# Patient Record
Sex: Male | Born: 1985
Health system: Southern US, Community
[De-identification: ages and names within clinical notes are randomized; demographics above are authoritative.]

## PROBLEM LIST (undated history)

## (undated) DIAGNOSIS — I10 Essential (primary) hypertension: Secondary | ICD-10-CM

## (undated) DIAGNOSIS — T23039A Burn of unspecified degree of unspecified multiple fingers (nail), not including thumb, initial encounter: Secondary | ICD-10-CM

## (undated) HISTORY — PX: CIRCUMCISION: SHX1350

---

## 2007-10-30 ENCOUNTER — Emergency Department (HOSPITAL_COMMUNITY): Admission: EM | Admit: 2007-10-30 | Discharge: 2007-10-30 | Payer: Self-pay | Admitting: Emergency Medicine

## 2009-10-24 ENCOUNTER — Emergency Department (HOSPITAL_COMMUNITY): Admission: EM | Admit: 2009-10-24 | Discharge: 2009-10-24 | Payer: Self-pay | Admitting: Emergency Medicine

## 2010-01-15 ENCOUNTER — Emergency Department (HOSPITAL_COMMUNITY)
Admission: EM | Admit: 2010-01-15 | Discharge: 2010-01-15 | Payer: Self-pay | Source: Home / Self Care | Admitting: Emergency Medicine

## 2010-07-28 ENCOUNTER — Emergency Department (HOSPITAL_COMMUNITY): Payer: No Typology Code available for payment source

## 2010-07-28 ENCOUNTER — Emergency Department (HOSPITAL_COMMUNITY)
Admission: EM | Admit: 2010-07-28 | Discharge: 2010-07-28 | Disposition: A | Discharge status: Home / Self Care | Payer: No Typology Code available for payment source | Attending: Emergency Medicine | Admitting: Emergency Medicine

## 2010-07-28 DIAGNOSIS — M542 Cervicalgia: Secondary | ICD-10-CM | POA: Insufficient documentation

## 2010-07-28 DIAGNOSIS — M549 Dorsalgia, unspecified: Secondary | ICD-10-CM | POA: Insufficient documentation

## 2010-07-28 DIAGNOSIS — S139XXA Sprain of joints and ligaments of unspecified parts of neck, initial encounter: Secondary | ICD-10-CM | POA: Insufficient documentation

## 2010-07-28 DIAGNOSIS — M25519 Pain in unspecified shoulder: Secondary | ICD-10-CM | POA: Insufficient documentation

## 2010-07-28 DIAGNOSIS — S335XXA Sprain of ligaments of lumbar spine, initial encounter: Secondary | ICD-10-CM | POA: Insufficient documentation

## 2011-11-07 ENCOUNTER — Emergency Department (HOSPITAL_COMMUNITY)
Admission: EM | Admit: 2011-11-07 | Discharge: 2011-11-07 | Disposition: A | Discharge status: Home / Self Care | Payer: Self-pay | Attending: Emergency Medicine | Admitting: Emergency Medicine

## 2011-11-07 ENCOUNTER — Encounter (HOSPITAL_COMMUNITY): Payer: Self-pay | Admitting: Emergency Medicine

## 2011-11-07 DIAGNOSIS — X58XXXA Exposure to other specified factors, initial encounter: Secondary | ICD-10-CM | POA: Insufficient documentation

## 2011-11-07 DIAGNOSIS — F1010 Alcohol abuse, uncomplicated: Secondary | ICD-10-CM | POA: Insufficient documentation

## 2011-11-07 DIAGNOSIS — S61209A Unspecified open wound of unspecified finger without damage to nail, initial encounter: Secondary | ICD-10-CM | POA: Insufficient documentation

## 2011-11-07 DIAGNOSIS — S61218A Laceration without foreign body of other finger without damage to nail, initial encounter: Secondary | ICD-10-CM

## 2011-11-07 DIAGNOSIS — F17200 Nicotine dependence, unspecified, uncomplicated: Secondary | ICD-10-CM | POA: Insufficient documentation

## 2011-11-07 MED ORDER — LIDOCAINE HCL 2 % IJ SOLN
INTRAMUSCULAR | Status: AC
  Administered 2011-11-07: 02:00:00
  Filled 2011-11-07: qty 20
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
## 2011-11-07 NOTE — ED Provider Notes (Signed)
Medical screening examination/treatment/procedure(s) were conducted as a shared visit with non-physician practitioner(s) and myself.  I personally evaluated the patient during the encounter  About 0.25" laceration right index finger. Finger distally neurovascularly intact.  Hanley Seamen, MD 11/07/11 [phone]

## 2012-12-24 ENCOUNTER — Encounter (HOSPITAL_COMMUNITY): Payer: Self-pay | Admitting: Emergency Medicine

## 2012-12-24 ENCOUNTER — Emergency Department (HOSPITAL_COMMUNITY)
Admission: EM | Admit: 2012-12-24 | Discharge: 2012-12-25 | Disposition: A | Discharge status: Home / Self Care | Payer: BC Managed Care – PPO | Attending: Emergency Medicine | Admitting: Emergency Medicine

## 2012-12-24 DIAGNOSIS — Z23 Encounter for immunization: Secondary | ICD-10-CM | POA: Insufficient documentation

## 2012-12-24 DIAGNOSIS — S0285XA Fracture of orbit, unspecified, initial encounter for closed fracture: Secondary | ICD-10-CM

## 2012-12-24 DIAGNOSIS — S02109A Fracture of base of skull, unspecified side, initial encounter for closed fracture: Secondary | ICD-10-CM | POA: Insufficient documentation

## 2012-12-24 DIAGNOSIS — F1010 Alcohol abuse, uncomplicated: Secondary | ICD-10-CM | POA: Insufficient documentation

## 2012-12-24 DIAGNOSIS — S0993XA Unspecified injury of face, initial encounter: Secondary | ICD-10-CM | POA: Insufficient documentation

## 2012-12-24 DIAGNOSIS — IMO0002 Reserved for concepts with insufficient information to code with codable children: Secondary | ICD-10-CM

## 2012-12-24 DIAGNOSIS — F17200 Nicotine dependence, unspecified, uncomplicated: Secondary | ICD-10-CM | POA: Insufficient documentation

## 2012-12-24 DIAGNOSIS — S0180XA Unspecified open wound of other part of head, initial encounter: Secondary | ICD-10-CM | POA: Insufficient documentation

## 2012-12-24 DIAGNOSIS — S0990XA Unspecified injury of head, initial encounter: Secondary | ICD-10-CM | POA: Insufficient documentation

## 2012-12-24 MED ORDER — TETANUS-DIPHTH-ACELL PERTUSSIS 5-2.5-18.5 LF-MCG/0.5 IM SUSP
0.5000 mL | Freq: Once | INTRAMUSCULAR | Status: AC
  Administered 2012-12-25: 0.5 mL via INTRAMUSCULAR
  Filled 2012-12-24: qty 0.5

## 2012-12-24 NOTE — ED Notes (Signed)
NP into room with security and [employer] present.
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
## 2012-12-24 NOTE — ED Notes (Signed)
Pt yelling obscenities at staff. NT at bedside to assist with cleaning wounds.
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
## 2012-12-24 NOTE — ED Notes (Addendum)
EDNP into room, visitor instructed NP to wait, NP out of room.
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
## 2012-12-24 NOTE — ED Notes (Signed)
Pt alert, NAD, calm, interactive with visitor in room, ambulatory in room with steady gait, pt cursing as part of general language.

## 2012-12-25 ENCOUNTER — Encounter (HOSPITAL_COMMUNITY): Payer: Self-pay | Admitting: Radiology

## 2012-12-25 ENCOUNTER — Emergency Department (HOSPITAL_COMMUNITY): Payer: BC Managed Care – PPO

## 2012-12-25 MED ORDER — LIDOCAINE-EPINEPHRINE 2 %-1:100000 IJ SOLN
20.0000 mL | Freq: Once | INTRAMUSCULAR | Status: AC
  Administered 2012-12-25: 20 mL

## 2012-12-25 MED ORDER — HYDROCODONE-ACETAMINOPHEN 5-325 MG PO TABS: 1.0000 | ORAL_TABLET | ORAL | Status: DC | PRN

## 2012-12-25 MED ORDER — IBUPROFEN 600 MG PO TABS: 600.0000 mg | ORAL_TABLET | Freq: Four times a day (QID) | ORAL | Status: DC | PRN

## 2012-12-25 NOTE — ED Notes (Signed)
Pt to CT via stretcher, alert, NAD, calm, no changes.
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
## 2012-12-25 NOTE — ED Notes (Signed)
Pt ambulatory with unsteady gait to room FT10 from room FT4, fall risk explained, fall risk band placed, new ID band placed after pt ripped of last one placed. Placed in stretcher with SR up x2, instructed to not get up, ask for help, CBIR, visitor at [facility], pt & visitor verbalize understanding.

## 2012-12-25 NOTE — ED Notes (Addendum)
Back from CT, no changes, visitor at [facility], CBIR.

## 2012-12-26 NOTE — ED Provider Notes (Signed)
Medical screening examination/treatment/procedure(s) were performed by non-physician practitioner and as supervising physician I was immediately available for consultation/collaboration.    Sunnie Nielsen, MD 12/26/12 [phone]

## 2013-08-09 ENCOUNTER — Other Ambulatory Visit: Payer: Self-pay | Admitting: Orthopaedic Surgery

## 2013-09-07 ENCOUNTER — Encounter (HOSPITAL_BASED_OUTPATIENT_CLINIC_OR_DEPARTMENT_OTHER): Payer: Self-pay | Admitting: *Deleted

## 2013-09-07 NOTE — H&P (Signed)
Daniel Strickland is an 28 y.o. male.   Chief Complaint: left knee pain HPI: Kaycen continues with some knee pain and feelings of instability. He is wearing a brace. He says his motion is a little better. He is about a month from his injury at this point. He has been through an MRI scan. He continues to work his job in [employer].   MRI:  I reviewed an MRI scan films and report of a study done at the [facility] on 07/03/13. This shows a complete ACL tear along with a radial tear of the medial meniscus in the posterior horn. It also shows a nondisplaced impaction fracture of the posterior medial aspect of the tibial plateau.  History reviewed. No pertinent past medical history.  Past Surgical History  Procedure Laterality Date  . Circumsion      at age 28    History reviewed. No pertinent family history. Social History:  reports that he has been smoking Cigarettes.  He has been smoking about 0.00 packs per day. He does not have any smokeless tobacco history on file. He reports that he drinks alcohol. He reports that he does not use illicit drugs.  Allergies: No Known Allergies  No prescriptions prior to admission    No results found for this or any previous visit (from the past 48 hour(s)). No results found.  Review of Systems  Musculoskeletal: Positive for joint pain.       Left knee  All other systems reviewed and are negative.   Height 6' (1.829 m), weight 112.038 kg (247 lb). Physical Exam  Constitutional: He is oriented to person, place, and time. He appears well-developed and well-nourished.  HENT:  Head: Normocephalic and atraumatic.  Eyes: Conjunctivae and EOM are normal. Pupils are equal, round, and reactive to light.  Neck: Normal range of motion.  Cardiovascular: Normal rate and regular rhythm.   Respiratory: Effort normal.  GI: Soft.  Musculoskeletal:  Left knee has trace effusion today. Motion is 0-130. He has pain on the medial joint line. Lachman's  test is loose as his drawer test. He has good stability to varus and valgus stress.  Hip motion is full and pain free and SLR is negative on both sides.  There is no palpable LAD behind either knee.  Sensation and motor function are intact on both sides and there are palpable pulses on both sides.    Neurological: He is alert and oriented to person, place, and time.  Skin: Skin is warm and dry.  Psychiatric: He has a normal mood and affect. His behavior is normal. Judgment and thought content normal.     Assessment/Plan Assessment:   Left knee ACL tear  Plan: Chay has an unstable knee. He would like to get back to basketball and also have a knee stable enough to do his job without trouble. I reviewed risk of anesthesia, infection, and DVT related to an ACL reconstruction with middle third patellar tendon autograft. We will also take care of the small meniscal injury. I also stressed the importance of the postoperative rehabilitation protocol to optimize his result.  Nataliee Shurtz, Ginger OrganNDREW PAUL 09/07/2013, 9:04 AM

## 2013-09-07 NOTE — Pre-Procedure Instructions (Signed)
Pack an overnight bag for RCC.  

## 2013-09-12 ENCOUNTER — Ambulatory Visit (HOSPITAL_BASED_OUTPATIENT_CLINIC_OR_DEPARTMENT_OTHER): Admission: RE | Admit: 2013-09-12 | Payer: 59 | Source: Ambulatory Visit | Admitting: Orthopaedic Surgery

## 2013-09-12 SURGERY — REPAIR, KNEE, ACL
Anesthesia: Choice | Site: Knee | Laterality: Left

## 2014-07-25 ENCOUNTER — Emergency Department (HOSPITAL_COMMUNITY)
Admission: EM | Admit: 2014-07-25 | Discharge: 2014-07-25 | Disposition: A | Discharge status: Home / Self Care | Payer: 59 | Attending: Emergency Medicine | Admitting: Emergency Medicine

## 2014-07-25 ENCOUNTER — Encounter (HOSPITAL_COMMUNITY): Payer: Self-pay | Admitting: *Deleted

## 2014-07-25 ENCOUNTER — Emergency Department (HOSPITAL_COMMUNITY): Payer: 59

## 2014-07-25 DIAGNOSIS — Y9367 Activity, basketball: Secondary | ICD-10-CM | POA: Insufficient documentation

## 2014-07-25 DIAGNOSIS — X58XXXA Exposure to other specified factors, initial encounter: Secondary | ICD-10-CM | POA: Insufficient documentation

## 2014-07-25 DIAGNOSIS — Y998 Other external cause status: Secondary | ICD-10-CM | POA: Insufficient documentation

## 2014-07-25 DIAGNOSIS — Y92310 Basketball court as the place of occurrence of the external cause: Secondary | ICD-10-CM | POA: Insufficient documentation

## 2014-07-25 DIAGNOSIS — S4991XA Unspecified injury of right shoulder and upper arm, initial encounter: Secondary | ICD-10-CM | POA: Insufficient documentation

## 2014-07-25 DIAGNOSIS — Z720 Tobacco use: Secondary | ICD-10-CM | POA: Insufficient documentation

## 2014-07-25 DIAGNOSIS — M25511 Pain in right shoulder: Secondary | ICD-10-CM

## 2014-07-25 MED ORDER — IBUPROFEN 800 MG PO TABS: 800.0000 mg | ORAL_TABLET | Freq: Three times a day (TID) | ORAL | Status: DC

## 2014-07-25 NOTE — ED Notes (Addendum)
Pt states that he was playing basketball with his kids and he felt his right shoulder pop out of place. States that he was able to get it back in and when he put his shirt back on the shoulder came back out of place. States that it went back in on its own but he feels like it will pop back out again. Strong pulse to rt wrist present. Normal coloring.

## 2014-07-25 NOTE — ED Provider Notes (Signed)
CSN: [id number]     Arrival date & time 07/25/14  1849 History  This chart was scribed for Roxy Horseman, PA-C working with Cathren Laine, MD by Evon Slack, ED Scribe. This patient was seen in room TR03C/TR03C and the patient's care was started at 8:02 PM.    Chief Complaint  Patient presents with  . Shoulder Injury   Patient is a 29 y.o. male presenting with shoulder injury. The history is provided by the patient. No language interpreter was used.  Shoulder Injury Pertinent negatives include no chest pain and no shortness of breath.   HPI Comments: Kwadwo Taras Matsumoto is a 29 y.o. male who presents to the Emergency Department complaining of right shoulder injury onset today PTA. Pt states that he was playing basketball and felt his shoulder pop out of place after swatting the ball. Pt state that he was able to pop the shoulder back into place. He states that when placing his shirt back on the shoulder re-dislocated. Pt states that he feels as if the shoulder may pop back out again. Pt doesn't report any medications PTA. Pt states that 2 months prior his shoulder did pop out of place but popped back in with no other complications. Pt doesn't report any numbness or tingling.   History reviewed. No pertinent past medical history. Past Surgical History  Procedure Laterality Date  . Circumsion      at age 38   No family history on file. History  Substance Use Topics  . Smoking status: Current Every Day Smoker -- 0.50 packs/day    Types: Cigarettes  . Smokeless tobacco: Not on file     Comment: Smokea 1-2 cigarettes a week  . Alcohol Use: Yes     Comment: Drinks weekends    Review of Systems  Constitutional: Negative for fever and chills.  Respiratory: Negative for shortness of breath.   Cardiovascular: Negative for chest pain.  Gastrointestinal: Negative for nausea, vomiting, diarrhea and constipation.  Genitourinary: Negative for dysuria.  Musculoskeletal: Positive for  arthralgias.  Neurological: Negative for numbness.      Allergies  Review of patient's allergies indicates no known allergies.  Home Medications   Prior to Admission medications   Not on File   BP 137/95 mmHg  Pulse 102  Temp(Src) 98.5 F (36.9 C) (Oral)  Resp 18  Ht 6' (1.829 m)  Wt 233 lb (105.688 kg)  BMI 31.59 kg/m2  SpO2 98%   Physical Exam  Constitutional: He is oriented to person, place, and time. He appears well-developed and well-nourished. No distress.  HENT:  Head: Normocephalic and atraumatic.  Eyes: Conjunctivae and EOM are normal.  Neck: Neck supple. No tracheal deviation present.  Cardiovascular: Intact distal pulses.   Pulmonary/Chest: Effort normal. No respiratory distress.  Musculoskeletal: Normal range of motion.  Right shoulder mildly ttp, no bony abnormality, ROM and strength limited 2/2 pain  Neurological: He is alert and oriented to person, place, and time.  Sensation intact  Skin: Skin is warm and dry.  Psychiatric: He has a normal mood and affect. His behavior is normal.  Nursing note and vitals reviewed.   ED Course  Procedures (including critical care time) DIAGNOSTIC STUDIES: Oxygen Saturation is 98% on RA, normal by my interpretation.    COORDINATION OF CARE: 8:10 PM-Discussed treatment plan with pt at bedside and pt agreed to plan.     Labs Review Labs Reviewed - No data to display  Imaging Review Dg Shoulder Right  07/25/2014  CLINICAL DATA:  The patient reports his right shoulder popped out of place while playing basketball today. Pain. Initial encounter.  EXAM: RIGHT SHOULDER - 2+ VIEW  COMPARISON:  None.  FINDINGS: The humerus is located. No fracture is identified. The acromioclavicular joint is intact. Image right lung and ribs appear normal.  IMPRESSION: Negative exam.   Electronically Signed   By: Drusilla Kannerhomas  Dalessio M.D.   On: 07/25/2014 19:52     EKG Interpretation None      MDM   Final diagnoses:  Right shoulder  pain    Patient with right shoulder pain.  Imaging is negative.  Patient states that the shoulder has dislocated and relocated several times today.  Will give sling immobilizer and recommend ortho follow-up.  I personally performed the services described in this documentation, which was scribed in my presence. The recorded information has been reviewed and is accurate.       Roxy Horsemanobert Dewane Timson, PA-C 07/25/14 2023  Cathren LaineKevin Steinl, MD 07/30/14 [phone]

## 2014-07-25 NOTE — Discharge Instructions (Signed)
Acromioclavicular Injuries °The AC (acromioclavicular) joint is the joint in the shoulder where the collarbone (clavicle) meets the shoulder blade (scapula). The part of the shoulder blade connected to the collarbone is called the acromion. Common problems with and treatments for the AC joint are detailed below. °ARTHRITIS °Arthritis occurs when the joint has been injured and the smooth padding between the joints (cartilage) is lost. This is the wear and tear seen in most joints of the body if they have been overused. This causes the joint to produce pain and swelling which is worse with activity.  °AC JOINT SEPARATION °AC joint separation means that the ligaments connecting the acromion of the shoulder blade and collarbone have been damaged, and the two bones no longer line up. AC separations can be anywhere from mild to severe, and are "graded" depending upon which ligaments are torn and how badly they are torn. °· Grade I Injury: the least damage is done, and the AC joint still lines up. °· Grade II Injury: damage to the ligaments which reinforce the AC joint. In a Grade II injury, these ligaments are stretched but not entirely torn. When stressed, the AC joint becomes painful and unstable. °· Grade III Injury: AC and secondary ligaments are completely torn, and the collarbone is no longer attached to the shoulder blade. This results in deformity; a prominence of the end of the clavicle. °AC JOINT FRACTURE °AC joint fracture means that there has been a break in the bones of the AC joint, usually the end of the clavicle. °TREATMENT °TREATMENT OF AC ARTHRITIS °· There is currently no way to replace the cartilage damaged by arthritis. The best way to improve the condition is to decrease the activities which aggravate the problem. Application of ice to the joint helps decrease pain and soreness (inflammation). The use of non-steroidal anti-inflammatory medication is helpful. °· If less conservative measures do not  work, then cortisone shots (injections) may be used. These are anti-inflammatories; they decrease the soreness in the joint and swelling. °· If non-surgical measures fail, surgery may be recommended. The procedure is generally removal of a portion of the end of the clavicle. This is the part of the collarbone closest to your acromion which is stabilized with ligaments to the acromion of the shoulder blade. This surgery may be performed using a tube-like instrument with a light (arthroscope) for looking into a joint. It may also be performed as an open surgery through a small incision by the surgeon. Most patients will have good range of motion within 6 weeks and may return to all activity including sports by 8-12 weeks, barring complications. °TREATMENT OF AN AC SEPARATION °· The initial treatment is to decrease pain. This is best accomplished by immobilizing the arm in a sling and placing an ice pack to the shoulder for 20 to 30 minutes every 2 hours as needed. As the pain starts to subside, it is important to begin moving the fingers, wrist, elbow and eventually the shoulder in order to prevent a stiff or "frozen" shoulder. Instruction on when and how much to move the shoulder will be provided by your caregiver. The length of time needed to regain full motion and function depends on the amount or grade of the injury. Recovery from a Grade I AC separation usually takes 10 to 14 days, whereas a Grade III may take 6 to 8 weeks. °· Grade I and II separations usually do not require surgery. Even Grade III injuries usually allow return to full   activity with few restrictions. Treatment is also based on the activity demands of the injured shoulder. For example, a high level quarterback with an injured throwing arm will receive more aggressive treatment than someone with a desk job who rarely uses his/her arm for strenuous activities. In some cases, a painful lump may persist which could require a later surgery. Surgery  can be very successful, but the benefits must be weighed against the potential risks. °TREATMENT OF AN AC JOINT FRACTURE °Fracture treatment depends on the type of fracture. Sometimes a splint or sling may be all that is required. Other times surgery may be required for repair. This is more frequently the case when the ligaments supporting the clavicle are completely torn. Your caregiver will help you with these decisions and together you can decide what will be the best treatment. °HOME CARE INSTRUCTIONS  °· Apply ice to the injury for 15-20 minutes each hour while awake for 2 days. Put the ice in a plastic bag and place a towel between the bag of ice and skin. °· If a sling has been applied, wear it constantly for as long as directed by your caregiver, even at night. The sling or splint can be removed for bathing or showering or as directed. Be sure to keep the shoulder in the same place as when the sling is on. Do not lift the arm. °· If a figure-of-eight splint has been applied it should be tightened gently by another person every day. Tighten it enough to keep the shoulders held back. Allow enough room to place the index finger between the body and strap. Loosen the splint immediately if there is numbness or tingling in the hands. °· Take over-the-counter or prescription medicines for pain, discomfort or fever as directed by your caregiver. °· If you or your child has received a follow up appointment, it is very important to keep that appointment in order to avoid long term complications, chronic pain or disability. °SEEK MEDICAL CARE IF:  °· The pain is not relieved with medications. °· There is increased swelling or discoloration that continues to get worse rather than better. °· You or your child has been unable to follow up as instructed. °· There is progressive numbness and tingling in the arm, forearm or hand. °SEEK IMMEDIATE MEDICAL CARE IF:  °· The arm is numb, cold or pale. °· There is increasing pain  in the hand, forearm or fingers. °MAKE SURE YOU:  °· Understand these instructions. °· Will watch your condition. °· Will get help right away if you are not doing well or get worse. °Document Released: 10/15/2004 Document Revised: 03/30/2011 Document Reviewed: 04/09/2008 °[employer]® Patient Information ©2015 [employer]. This information is not intended to replace advice given to you by your health care provider. Make sure you discuss any questions you have with your health care provider. ° °

## 2014-07-25 NOTE — ED Notes (Signed)
Pt verbalizes understanding of d/c instructions and denies any further needs at this time. 

## 2014-08-01 ENCOUNTER — Emergency Department (HOSPITAL_COMMUNITY)
Admission: EM | Admit: 2014-08-01 | Discharge: 2014-08-01 | Disposition: A | Discharge status: Home / Self Care | Payer: 59 | Attending: Emergency Medicine | Admitting: Emergency Medicine

## 2014-08-01 ENCOUNTER — Encounter (HOSPITAL_COMMUNITY): Payer: Self-pay | Admitting: Emergency Medicine

## 2014-08-01 ENCOUNTER — Emergency Department (HOSPITAL_COMMUNITY): Payer: 59

## 2014-08-01 DIAGNOSIS — S6991XA Unspecified injury of right wrist, hand and finger(s), initial encounter: Secondary | ICD-10-CM | POA: Insufficient documentation

## 2014-08-01 DIAGNOSIS — X58XXXA Exposure to other specified factors, initial encounter: Secondary | ICD-10-CM | POA: Insufficient documentation

## 2014-08-01 DIAGNOSIS — Y9389 Activity, other specified: Secondary | ICD-10-CM | POA: Insufficient documentation

## 2014-08-01 DIAGNOSIS — Y9289 Other specified places as the place of occurrence of the external cause: Secondary | ICD-10-CM | POA: Insufficient documentation

## 2014-08-01 DIAGNOSIS — Z720 Tobacco use: Secondary | ICD-10-CM | POA: Insufficient documentation

## 2014-08-01 DIAGNOSIS — Y998 Other external cause status: Secondary | ICD-10-CM | POA: Insufficient documentation

## 2014-08-01 DIAGNOSIS — M25531 Pain in right wrist: Secondary | ICD-10-CM

## 2014-08-01 MED ORDER — IBUPROFEN 400 MG PO TABS
400.0000 mg | ORAL_TABLET | Freq: Once | ORAL | Status: AC
  Administered 2014-08-01: 400 mg via ORAL
  Filled 2014-08-01: qty 1

## 2014-08-01 MED ORDER — IBUPROFEN 800 MG PO TABS: 800.0000 mg | ORAL_TABLET | Freq: Three times a day (TID) | ORAL | Status: DC

## 2014-08-01 NOTE — ED Provider Notes (Signed)
CSN: [id number]     Arrival date & time 08/01/14  1427 History   This chart was scribed for non-physician practitioner Joycie PeekBenjamin Bohdi Leeds, PA-C working with Raeford RazorStephen Kohut, MD by Lyndel SafeKaitlyn Shelton, ED Scribe. This patient was seen in room TR04C/TR04C and the patient's care was started at 3:47 PM.    Chief Complaint  Patient presents with  . Wrist Pain   The history is provided by the patient. No language interpreter was used.   HPI Comments: Daniel Strickland is a 29 y.o. male who presents to the Emergency Department complaining of sudden onset, constant, sharp right wrist pain s/p injury that occurred 1 day ago. He states the pain is exacerbated with movement. Pt reports he was changing a tire yesterday when he felt his right wrist Strickland. He notes he went to work this morning and was sent home because he could not perform his normal work duties. He has not tried any alleviating medications pta. Denies numbness.   History reviewed. No pertinent past medical history. Past Surgical History  Procedure Laterality Date  . Circumsion      at age 29   History reviewed. No pertinent family history. History  Substance Use Topics  . Smoking status: Current Every Day Smoker -- 0.50 packs/day    Types: Cigarettes  . Smokeless tobacco: Not on file     Comment: Smokea 1-2 cigarettes a week  . Alcohol Use: Yes     Comment: Drinks weekends    Review of Systems  Musculoskeletal: Positive for arthralgias.  Neurological: Negative for numbness.   Allergies  Review of patient's allergies indicates no known allergies.  Home Medications   Prior to Admission medications   Medication Sig Start Date End Date Taking? Authorizing Provider  ibuprofen (ADVIL,MOTRIN) 800 MG tablet Take 1 tablet (800 mg total) by mouth 3 (three) times daily. 08/01/14   Joycie PeekBenjamin Isamar Nazir, PA-C   BP 141/87 mmHg  Pulse 90  Temp(Src) 98.4 F (36.9 C) (Oral)  Resp 18  Ht 6' (1.829 m)  Wt 245 lb 8 oz (111.358 kg)  BMI 33.29 kg/m2   SpO2 98% Physical Exam  Constitutional: He is oriented to person, place, and time. He appears well-developed and well-nourished. No distress.  HENT:  Head: Normocephalic.  Right Ear: External ear normal.  Left Ear: External ear normal.  Mouth/Throat: No oropharyngeal exudate.  Eyes: Pupils are equal, round, and reactive to light. Right eye exhibits no discharge. Left eye exhibits no discharge. No scleral icterus.  Neck: No JVD present.  Cardiovascular: Normal rate, regular rhythm and normal heart sounds.   Pulmonary/Chest: Effort normal and breath sounds normal. No respiratory distress.  Musculoskeletal: He exhibits no edema.  Range of motion of right wrist slightly limited secondary to only pain. No focal bony tenderness noted. Grip strength decreased secondary to pain only. No obvious lesions or deformities. No overt erythema, warmth or swelling noted. Full range of motion of right elbow  Lymphadenopathy:    He has no cervical adenopathy.  Neurological: He is alert and oriented to person, place, and time.  Cranial nerves II-12 grossly intact. Motor strength grossly intact. Sensation intact to light touch.  Skin: Skin is warm. No rash noted. No erythema. No pallor.  Psychiatric: He has a normal mood and affect. His behavior is normal.  Nursing note and vitals reviewed.   ED Course  Procedures  DIAGNOSTIC STUDIES: Oxygen Saturation is 98% on RA, normal by my interpretation.    COORDINATION OF CARE: 3:50 PM Discussed  treatment plan which includes to order diagnostic imaging of right wrist with pt. Pt acknowledges and agrees to plan.   Imaging Review Dg Wrist Complete Right  08/01/2014   CLINICAL DATA:  Wrist pain. Injury yesterday. Initial encounter. Pain with movement.  EXAM: RIGHT WRIST - COMPLETE 3+ VIEW  COMPARISON:  None.  FINDINGS: Carpal spacing and alignment is within normal limits. No fracture. There is truncation of the ulnar styloid that is consistent with a small erosion  of the ulnar styloid. This is typically associated with inflammatory arthritis such as rheumatoid but can be produced by other forms of synovitis. Nonspecific carpal cyst is present in the lunate.  IMPRESSION: No acute osseous abnormality. Erosion of the ulnar styloid, most commonly associated with inflammatory arthritis such as rheumatoid.   Electronically Signed   By: Andreas Newport M.D.   On: 08/01/2014 16:21    Meds given in ED:  Medications  ibuprofen (ADVIL,MOTRIN) tablet 400 mg (400 mg Oral Given 08/01/14 1555)    New Prescriptions   No medications on file   Filed Vitals:   08/01/14 1546  BP: 141/87  Pulse: 90  Temp: 98.4 F (36.9 C)  TempSrc: Oral  Resp: 18  Height: 6' (1.829 m)  Weight: 245 lb 8 oz (111.358 kg)  SpO2: 98%    MDM  Vitals stable - WNL -afebrile Pt resting comfortably in ED. PE-neurovascularly intact. No evidence of hemarthrosis or septic joint. Range of motion decreased secondary only to pain. However mostly full range of motion is intact. Imaging--plain films of right wrist show no acute osseous abnormalities. There is erosion of the ulnar styloid, most commonly associated with inflammatory arthritis such as rheumatoid. I personally reviewed the imaging and agree with the results as interpreted by the radiologist.  DDX--patient given wrist splint for comfort, anti-inflammatory's for pain. Discussed follow-up with primary care/rheumatology for further evaluation and management of symptoms. No evidence of other acute emergent pathology at this time.  I discussed all relevant lab findings and imaging results with pt and they verbalized understanding. Discussed f/u with PCP within 48 hrs and return precautions, pt very amenable to plan.  Final diagnoses:  Wrist pain, acute, right   I personally performed the services described in this documentation, which was scribed in my presence. The recorded information has been reviewed and is  accurate.    Joycie Peek, PA-C 08/01/14 1648  Raeford Razor, MD 08/03/14 1440

## 2014-08-01 NOTE — Discharge Instructions (Signed)
You were evaluated for your wrist pain. There does not appear to be an emergent cause your symptoms at this time. Your x-rays were negative for any broken bones or dislocations. He will need follow-up with primary care for further valuation management of your symptoms. Return to ED for worsening symptoms. Take your anti-inflammatory's as needed for pain.  Arthralgia Your caregiver has diagnosed you as suffering from an arthralgia. Arthralgia means there is pain in a joint. This can come from many reasons including:  Bruising the joint which causes soreness (inflammation) in the joint.  Wear and tear on the joints which occur as we grow older (osteoarthritis).  Overusing the joint.  Various forms of arthritis.  Infections of the joint. Regardless of the cause of pain in your joint, most of these different pains respond to anti-inflammatory drugs and rest. The exception to this is when a joint is infected, and these cases are treated with antibiotics, if it is a bacterial infection. HOME CARE INSTRUCTIONS   Rest the injured area for as long as directed by your caregiver. Then slowly start using the joint as directed by your caregiver and as the pain allows. Crutches as directed may be useful if the ankles, knees or hips are involved. If the knee was splinted or casted, continue use and care as directed. If an stretchy or elastic wrapping bandage has been applied today, it should be removed and re-applied every 3 to 4 hours. It should not be applied tightly, but firmly enough to keep swelling down. Watch toes and feet for swelling, bluish discoloration, coldness, numbness or excessive pain. If any of these problems (symptoms) occur, remove the ace bandage and re-apply more loosely. If these symptoms persist, contact your caregiver or return to this location.  For the first 24 hours, keep the injured extremity elevated on pillows while lying down.  Apply ice for 15-20 minutes to the sore joint every  couple hours while awake for the first half day. Then 03-04 times per day for the first 48 hours. Put the ice in a plastic bag and place a towel between the bag of ice and your skin.  Wear any splinting, casting, elastic bandage applications, or slings as instructed.  Only take over-the-counter or prescription medicines for pain, discomfort, or fever as directed by your caregiver. Do not use aspirin immediately after the injury unless instructed by your physician. Aspirin can cause increased bleeding and bruising of the tissues.  If you were given crutches, continue to use them as instructed and do not resume weight bearing on the sore joint until instructed. Persistent pain and inability to use the sore joint as directed for more than 2 to 3 days are warning signs indicating that you should see a caregiver for a follow-up visit as soon as possible. Initially, a hairline fracture (break in bone) may not be evident on X-rays. Persistent pain and swelling indicate that further evaluation, non-weight bearing or use of the joint (use of crutches or slings as instructed), or further X-rays are indicated. X-rays may sometimes not show a small fracture until a week or 10 days later. Make a follow-up appointment with your own caregiver or one to whom we have referred you. A radiologist (specialist in reading X-rays) may read your X-rays. Make sure you know how you are to obtain your X-ray results. Do not assume everything is normal if you do not hear from us. SEEK MEDICAL CARE IF: Bruising, swelling, or pain increases. SEEK IMMEDIATE MEDICAL CARE IF:  Your fingers or toes are numb or blue.  The pain is not responding to medications and continues to stay the same or get worse.  The pain in your joint becomes severe.  You develop a fever over 102 F (38.9 C).  It becomes impossible to move or use the joint. MAKE SURE YOU:   Understand these instructions.  Will watch your condition.  Will get help  right away if you are not doing well or get worse. Document Released: 01/05/2005 Document Revised: 03/30/2011 Document Reviewed: 08/24/2007 [facility] Patient Information 2015 [place], [place]. This information is not intended to replace advice given to you by your health care provider. Make sure you discuss any questions you have with your health care provider.

## 2014-08-01 NOTE — ED Notes (Signed)
Pt sts right wrist pain after changing tire yesterday; pt sts pain with movement, CMS intact

## 2014-08-01 NOTE — ED Notes (Signed)
Pt called no answer 

## 2014-12-12 ENCOUNTER — Inpatient Hospital Stay (HOSPITAL_COMMUNITY)
Admission: EM | Admit: 2014-12-12 | Discharge: 2014-12-13 | DRG: 918 | Disposition: A | Discharge status: Home / Self Care | Payer: 59 | Attending: Family Medicine | Admitting: Family Medicine

## 2014-12-12 ENCOUNTER — Encounter (HOSPITAL_COMMUNITY): Payer: Self-pay | Admitting: Emergency Medicine

## 2014-12-12 DIAGNOSIS — F329 Major depressive disorder, single episode, unspecified: Secondary | ICD-10-CM | POA: Diagnosis present

## 2014-12-12 DIAGNOSIS — F1290 Cannabis use, unspecified, uncomplicated: Secondary | ICD-10-CM | POA: Diagnosis present

## 2014-12-12 DIAGNOSIS — F17210 Nicotine dependence, cigarettes, uncomplicated: Secondary | ICD-10-CM | POA: Diagnosis present

## 2014-12-12 DIAGNOSIS — T391X4S Poisoning by 4-Aminophenol derivatives, undetermined, sequela: Secondary | ICD-10-CM | POA: Diagnosis not present

## 2014-12-12 DIAGNOSIS — T391X2A Poisoning by 4-Aminophenol derivatives, intentional self-harm, initial encounter: Principal | ICD-10-CM | POA: Diagnosis present

## 2014-12-12 DIAGNOSIS — R4589 Other symptoms and signs involving emotional state: Secondary | ICD-10-CM | POA: Diagnosis present

## 2014-12-12 DIAGNOSIS — T391X4A Poisoning by 4-Aminophenol derivatives, undetermined, initial encounter: Secondary | ICD-10-CM | POA: Diagnosis present

## 2014-12-12 DIAGNOSIS — R4689 Other symptoms and signs involving appearance and behavior: Secondary | ICD-10-CM

## 2014-12-12 LAB — COMPREHENSIVE METABOLIC PANEL
ALBUMIN: 4.4 g/dL (ref 3.5–5.0)
ALT: 25 U/L (ref 17–63)
ANION GAP: 11 (ref 5–15)
AST: 24 U/L (ref 15–41)
Alkaline Phosphatase: 73 U/L (ref 38–126)
BILIRUBIN TOTAL: 0.6 mg/dL (ref 0.3–1.2)
BUN: 8 mg/dL (ref 6–20)
CALCIUM: 9.2 mg/dL (ref 8.9–10.3)
CO2: 24 mmol/L (ref 22–32)
Chloride: 105 mmol/L (ref 101–111)
Creatinine, Ser: 1.26 mg/dL — ABNORMAL HIGH (ref 0.61–1.24)
GFR calc Af Amer: >60 mL/min (ref >60)
GFR calc non Af Amer: >60 mL/min (ref >60)
Glucose, Bld: 121 mg/dL — ABNORMAL HIGH (ref 65–99)
POTASSIUM: 4.3 mmol/L (ref 3.5–5.1)
SODIUM: 140 mmol/L (ref 135–145)
TOTAL PROTEIN: 7.7 g/dL (ref 6.5–8.1)

## 2014-12-12 LAB — RAPID URINE DRUG SCREEN, HOSP PERFORMED
Amphetamines: NOT DETECTED
Barbiturates: NOT DETECTED
Benzodiazepines: NOT DETECTED
COCAINE: POSITIVE — AB
OPIATES: NOT DETECTED
Tetrahydrocannabinol: POSITIVE — AB

## 2014-12-12 LAB — ETHANOL: Alcohol, Ethyl (B): 214 mg/dL — ABNORMAL HIGH (ref <5)

## 2014-12-12 LAB — CBC
HCT: 48 % (ref 39.0–52.0)
Hemoglobin: 16.7 g/dL (ref 13.0–17.0)
MCH: 31.5 pg (ref 26.0–34.0)
MCHC: 34.8 g/dL (ref 30.0–36.0)
MCV: 90.6 fL (ref 78.0–100.0)
Platelets: 223 10*3/uL (ref 150–400)
RBC: 5.3 MIL/uL (ref 4.22–5.81)
RDW: 12.4 % (ref 11.5–15.5)
WBC: 9.2 10*3/uL (ref 4.0–10.5)

## 2014-12-12 LAB — ACETAMINOPHEN LEVEL: ACETAMINOPHEN (TYLENOL), SERUM: 109 ug/mL — AB (ref 10–30)

## 2014-12-12 LAB — MRSA PCR SCREENING: MRSA by PCR: NEGATIVE

## 2014-12-12 LAB — SALICYLATE LEVEL: Salicylate Lvl: <4 mg/dL (ref 2.8–30.0)

## 2014-12-12 LAB — CBG MONITORING, ED: Glucose-Capillary: 117 mg/dL — ABNORMAL HIGH (ref 65–99)

## 2014-12-12 MED ORDER — ONDANSETRON HCL 4 MG/2ML IJ SOLN
4.0000 mg | Freq: Four times a day (QID) | INTRAMUSCULAR | Status: DC | PRN
Start: 2014-12-12 — End: 2014-12-13
  Administered 2014-12-12: 4 mg via INTRAVENOUS
  Filled 2014-12-12: qty 2

## 2014-12-12 MED ORDER — STERILE WATER FOR INJECTION IJ SOLN
INTRAMUSCULAR | Status: AC
Start: 2014-12-12 — End: 2014-12-12
  Administered 2014-12-12: 10 mL
  Filled 2014-12-12: qty 10

## 2014-12-12 MED ORDER — DEXTROSE 5 % IV SOLN
15.0000 mg/kg/h | INTRAVENOUS | Status: DC
  Administered 2014-12-12: 15 mg/kg/h via INTRAVENOUS
  Filled 2014-12-12: qty 200

## 2014-12-12 MED ORDER — SODIUM CHLORIDE 0.9 % IV SOLN: 250.0000 mL | INTRAVENOUS | Status: DC | PRN

## 2014-12-12 MED ORDER — ZIPRASIDONE MESYLATE 20 MG IM SOLR
10.0000 mg | Freq: Once | INTRAMUSCULAR | Status: AC
  Administered 2014-12-12: 10 mg via INTRAMUSCULAR
  Filled 2014-12-12: qty 20

## 2014-12-12 MED ORDER — SODIUM CHLORIDE 0.9 % IJ SOLN
3.0000 mL | Freq: Two times a day (BID) | INTRAMUSCULAR | Status: DC
  Administered 2014-12-12: 3 mL via INTRAVENOUS

## 2014-12-12 MED ORDER — ENOXAPARIN SODIUM 60 MG/0.6ML ~~LOC~~ SOLN
0.5000 mg/kg | SUBCUTANEOUS | Status: DC
Start: 2014-12-12 — End: 2014-12-13
  Administered 2014-12-12: 55 mg via SUBCUTANEOUS
  Filled 2014-12-12 (×2): qty 0.6

## 2014-12-12 MED ORDER — ACETYLCYSTEINE LOAD VIA INFUSION
150.0000 mg/kg | Freq: Once | INTRAVENOUS | Status: AC
  Administered 2014-12-12: 20415 mg via INTRAVENOUS
  Filled 2014-12-12: qty 511

## 2014-12-12 MED ORDER — SODIUM CHLORIDE 0.9 % IV BOLUS (SEPSIS)
1000.0000 mL | Freq: Once | INTRAVENOUS | Status: AC
Start: 2014-12-12 — End: 2014-12-12
  Administered 2014-12-12: 1000 mL via INTRAVENOUS

## 2014-12-12 MED ORDER — SODIUM CHLORIDE 0.9 % IJ SOLN: 3.0000 mL | INTRAMUSCULAR | Status: DC | PRN

## 2014-12-12 MED ORDER — DEXTROSE 5 % IV SOLN
15.0000 mg/kg/h | INTRAVENOUS | Status: AC
  Administered 2014-12-13 (×2): 15 mg/kg/h via INTRAVENOUS
  Filled 2014-12-12 (×2): qty 200

## 2014-12-12 NOTE — BH Assessment (Signed)
Assessment Note  Daniel Strickland is an 29 y.o. male. Per ED notes,  "Brought in by EMS from home with c/o suicidal ideations with attempt. Pt reports that he took a "handful of Ibuprofen and drank liquor" with intent to kill self but after drinking the liquor, he stated that he "threw up the pills". Pt reports that he had a birthday 3 days ago and everybody messed it up, stated that "everybody hates me". Pt called EMS stating, "I just want to know if I'm going to die when I fall asleep".   Writer met with patient who reported suicidal ideations. Patient would not elaborate on the trigger for his suicide attempt. Patient would not provide any details of the overdose, how much he consumed, and/or what he consumed.  He did however report a previous suicide attempt in which he was hospitalized at [facility] (yrs ago). Patient asked if had any other suicide attempts and he laughed stating, "Many oh it's been so many". Patient denies HI. Patient also denies AVH's stating, "I am not crazy". Patient denies drug use but reports alcohol use 2-3x's per week. Patient reports binge drinking with each use. Patient does not have a outpatient therapist or psychiatrist.   Diagnosis:   Past Medical History: History reviewed. No pertinent past medical history.  Past Surgical History  Procedure Laterality Date  . Circumsion      at age 33    Family History: History reviewed. No pertinent family history.  Social History:  reports that he has been smoking Cigarettes.  He has been smoking about 0.50 packs per day. He does not have any smokeless tobacco history on file. He reports that he drinks alcohol. He reports that he does not use illicit drugs.  Additional Social History:  Alcohol / Drug Use Pain Medications: SEE MAR Prescriptions: SEE MAR Over the Counter: SEE MAR History of alcohol / drug use?: Yes Substance #1 Name of Substance 1: Alcohol  1 - Age of First Use: "I don't even  know" 1 - Amount (size/oz): "I turn up" and "I get very drunk ...isn't that the point" 1 - Frequency: 2x's per week 1 - Duration: on-going  1 - Last Use / Amount: "This morning"  CIWA: CIWA-Ar BP: 160/96 mmHg Pulse Rate: 81 COWS:    Allergies: No Known Allergies  Home Medications:  (Not in a hospital admission)  OB/GYN Status:  No LMP for male patient.  General Assessment Data Location of Assessment: [facility] ED TTS Assessment: In system Is this a Tele or Face-to-Face Assessment?: Face-to-Face Is this an Initial Assessment or a Re-assessment for this encounter?: Initial Assessment Marital status: Single Maiden name:  (n/a) Is patient pregnant?: No Pregnancy Status: No Living Arrangements: Spouse/significant other Can pt return to current living arrangement?: Yes Admission Status: Involuntary (paperwork in process) Is patient capable of signing voluntary admission?: Yes Referral Source: Self/Family/Friend Insurance type:  (Self Pay )  Medical Screening Exam ([place]) Medical Exam completed: No  Crisis Care Plan Living Arrangements: Spouse/significant other Name of Psychiatrist:  (No psychiatrist ) Name of Therapist:  (No therapist )  Education Status Is patient currently in school?: No Current Grade:  (n/a) Highest grade of school patient has completed:  (H.S.) Name of school:  (n/a) Contact person:  (n/a)  Risk to self with the past 6 months Suicidal Ideation: Yes-Currently Present Has patient been a risk to self within the past 6 months prior to admission? : Yes Suicidal Intent: Yes-Currently Present  Has patient had any suicidal intent within the past 6 months prior to admission? : Yes Is patient at risk for suicide?: Yes Suicidal Plan?: Yes-Currently Present Has patient had any suicidal plan within the past 6 months prior to admission? : Yes Specify Current Suicidal Plan:  (overdose) Access to Means: Yes Specify Access to Suicidal Means:  (access to  medications and RX's) What has been your use of drugs/alcohol within the last 12 months?:  (patient reports drinking alcohol 2x's per week (binge)) Previous Attempts/Gestures: Yes How many times?:  (Patient sts, "Alot".. and laughs. ) Other Self Harm Risks:  (none reported) Triggers for Past Attempts:  (patient refused to disclose) Intentional Self Injurious Behavior: None Family Suicide History: Unknown Recent stressful life event(s):  (patient would not answer question) Persecutory voices/beliefs?: No Depression: No Depression Symptoms: Feeling angry/irritable, Feeling worthless/self pity, Loss of interest in usual pleasures, Fatigue, Guilt, Isolating, Insomnia, Tearfulness, Despondent Substance abuse history and/or treatment for substance abuse?: No Suicide prevention information given to non-admitted patients: Not applicable  Risk to Others within the past 6 months Homicidal Ideation: No Does patient have any lifetime risk of violence toward others beyond the six months prior to admission? : No Thoughts of Harm to Others: No Current Homicidal Intent: No Current Homicidal Plan: No Access to Homicidal Means: No Identified Victim:  (n/a) History of harm to others?: No Assessment of Violence: None Noted Violent Behavior Description:  (patient is calm and cooperative ) Does patient have access to weapons?: No Criminal Charges Pending?: No Does patient have a court date: No Is patient on probation?: No  Psychosis Hallucinations: None noted Delusions: None noted  Mental Status Report Appearance/Hygiene: Disheveled Eye Contact: Poor Motor Activity: Freedom of movement Speech: Logical/coherent Level of Consciousness: Alert Mood: Depressed Affect: Appropriate to circumstance Anxiety Level: None Thought Processes: Relevant, Coherent Judgement: Impaired Orientation: Person, Place, Time, Situation Obsessive Compulsive Thoughts/Behaviors: None  Cognitive  Functioning Concentration: Decreased Memory: Recent Intact, Remote Intact IQ: Average Insight: Fair Impulse Control: Poor Appetite: Fair Weight Loss:  (none reported) Weight Gain:  (none reported) Sleep: Decreased Total Hours of Sleep:  (n/a) Vegetative Symptoms: None  ADLScreening [facility] Assessment Services) Patient's cognitive ability adequate to safely complete daily activities?: Yes Patient able to express need for assistance with ADLs?: Yes Independently performs ADLs?: No  Prior Inpatient Therapy Prior Inpatient Therapy: Yes Prior Therapy Dates:  ("yrs ago") Prior Therapy Facilty/Provider(s):  [facility] in [place], [place]) Reason for Treatment:  (depression )  Prior Outpatient Therapy Prior Outpatient Therapy: No Prior Therapy Dates:  (n/a) Prior Therapy Facilty/Provider(s):  (n/a) Reason for Treatment:  (n/a) Does patient have an ACCT team?: No Does patient have Intensive In-House Services?  : No Does patient have [facility] services? : No Does patient have P4CC services?: No  ADL Screening (condition at time of admission) Patient's cognitive ability adequate to safely complete daily activities?: Yes Is the patient deaf or have difficulty hearing?: No Does the patient have difficulty seeing, even when wearing glasses/contacts?: No Does the patient have difficulty concentrating, remembering, or making decisions?: No Patient able to express need for assistance with ADLs?: Yes Does the patient have difficulty dressing or bathing?: No Independently performs ADLs?: No Communication: Independent Dressing (OT): Independent Grooming: Independent Feeding: Independent Bathing: Independent Toileting: Independent In/Out Bed: Independent Walks in Home: Independent Does the patient have difficulty walking or climbing stairs?: No Weakness of Legs: None Weakness of Arms/Hands: None  Home Assistive Devices/Equipment Home Assistive Devices/Equipment: None    Abuse/Neglect  Assessment (Assessment to be complete while patient is alone) Physical Abuse: Denies Verbal Abuse: Denies Sexual Abuse: Denies Exploitation of patient/patient's resources: Denies Self-Neglect: Denies Values / Beliefs Cultural Requests During Hospitalization: None Spiritual Requests During Hospitalization: None   Advance Directives (For Healthcare) Does patient have an advance directive?: No    Additional Information 1:1 In Past 12 Months?: No CIRT Risk: No Elopement Risk: No Does patient have medical clearance?: Yes     Disposition:  Disposition Initial Assessment Completed for this Encounter: Yes Disposition of Patient: Inpatient treatment program (Patient meets inpatient criteria per Dr. Darleene Cleaver)  On Site Evaluation by:   Reviewed with Physician:    Waldon Merl [facility] 12/12/2014 11:19 AM

## 2014-12-12 NOTE — ED Notes (Signed)
Brought in by EMS from home with c/o suicidal ideations with attempt.  Pt reports that he took a "handful of Ibuprofen and drank liquor" with intent to kill self but after drinking the liquor, he stated that he "threw up the pills".  Pt reports that he had a birthday 3 days ago and everybody messed it up, stated that "everybody hates me".  Pt called EMS stating, "I just want to know if I'm going to die when I fall asleep".  Per EMS, no bottle of Ibuprofen or emesis was noted at the scene.

## 2014-12-12 NOTE — ED Provider Notes (Signed)
CSN: [id number]     Arrival date & time 12/12/14  4098 History   First MD Initiated Contact with Patient 12/12/14 [phone]     Chief Complaint  Patient presents with  . Suicidal     (Consider location/radiation/quality/duration/timing/severity/associated sxs/prior Treatment) Patient is a 29 y.o. male presenting with altered mental status. The history is provided by the patient (The patient drank alcohol in the Tylenol and Motrin at around 1 AM today in attempt to kill himself. He has also used cocaine and marijuana).  Altered Mental Status Presenting symptoms: behavior changes and combativeness   Severity:  Moderate Episode history:  Single Timing:  Constant Progression:  Unchanged Chronicity:  New Context: alcohol use   Associated symptoms: agitation   Associated symptoms: no abdominal pain, no hallucinations, no headaches, no rash and no seizures     History reviewed. No pertinent past medical history. Past Surgical History  Procedure Laterality Date  . Circumsion      at age 73   History reviewed. No pertinent family history. Social History  Substance Use Topics  . Smoking status: Current Every Day Smoker -- 0.50 packs/day    Types: Cigarettes  . Smokeless tobacco: None     Comment: Smokea 1-2 cigarettes a week  . Alcohol Use: Yes     Comment: Drinks weekends    Review of Systems  Constitutional: Negative for appetite change and fatigue.  HENT: Negative for congestion, ear discharge and sinus pressure.   Eyes: Negative for discharge.  Respiratory: Negative for cough.   Cardiovascular: Negative for chest pain.  Gastrointestinal: Negative for abdominal pain and diarrhea.  Genitourinary: Negative for frequency and hematuria.  Musculoskeletal: Negative for back pain.  Skin: Negative for rash.  Neurological: Negative for seizures and headaches.  Psychiatric/Behavioral: Positive for agitation. Negative for hallucinations.      Allergies  Review of patient's  allergies indicates no known allergies.  Home Medications   Prior to Admission medications   Medication Sig Start Date End Date Taking? Authorizing Provider  acetaminophen (TYLENOL) 500 MG tablet Take 12,500 mg by mouth once.   Yes Historical Provider, MD  ibuprofen (ADVIL,MOTRIN) 800 MG tablet Take 1 tablet (800 mg total) by mouth 3 (three) times daily. Patient not taking: Reported on 12/12/2014 08/01/14   Joycie Peek, PA-C   BP 111/63 mmHg  Pulse 69  Temp(Src) 97.8 F (36.6 C) (Oral)  Resp 17  Ht 6' (1.829 m)  Wt 300 lb (136.079 kg)  BMI 40.68 kg/m2  SpO2 99% Physical Exam  Constitutional: He is oriented to person, place, and time. He appears well-developed.  HENT:  Head: Normocephalic.  Eyes: Conjunctivae and EOM are normal. No scleral icterus.  Neck: Neck supple. No thyromegaly present.  Cardiovascular: Normal rate and regular rhythm.  Exam reveals no gallop and no friction rub.   No murmur heard. Pulmonary/Chest: No stridor. He has no wheezes. He has no rales. He exhibits no tenderness.  Abdominal: He exhibits no distension. There is no tenderness. There is no rebound.  Musculoskeletal: Normal range of motion. He exhibits no edema.  Lymphadenopathy:    He has no cervical adenopathy.  Neurological: He is oriented to person, place, and time. He exhibits normal muscle tone. Coordination normal.  Skin: No rash noted. No erythema.  Psychiatric:  Suicidal    ED Course  Procedures (including critical care time) Labs Review Labs Reviewed  COMPREHENSIVE METABOLIC PANEL - Abnormal; Notable for the following:    Glucose, Bld 121 (*)  Creatinine, Ser 1.26 (*)    All other components within normal limits  ETHANOL - Abnormal; Notable for the following:    Alcohol, Ethyl (B) 214 (*)    All other components within normal limits  ACETAMINOPHEN LEVEL - Abnormal; Notable for the following:    Acetaminophen (Tylenol), Serum 109 (*)    All other components within normal  limits  URINE RAPID DRUG SCREEN, HOSP PERFORMED - Abnormal; Notable for the following:    Cocaine POSITIVE (*)    Tetrahydrocannabinol POSITIVE (*)    All other components within normal limits  CBG MONITORING, ED - Abnormal; Notable for the following:    Glucose-Capillary 117 (*)    All other components within normal limits  SALICYLATE LEVEL  CBC    Imaging Review No results found. I have personally reviewed and evaluated these images and lab results as part of my medical decision-making.   EKG Interpretation   Date/Time:  Wednesday December 12 2014 06:24:27 EST Ventricular Rate:  77 PR Interval:  174 QRS Duration: 97 QT Interval:  389 QTC Calculation: 440 R Axis:   42 Text Interpretation:  Sinus arrhythmia ST elev, probable normal early  repol pattern Confirmed by [facility] Health Pennock HospitalALUMBO-RASCH  MD, APRIL ([id number]) on 12/12/2014  6:28:42 AM     CRITICAL CARE Performed by: Morris Longenecker L Total critical care time:45 minutes Critical care time was exclusive of separately billable procedures and treating other patients. Critical care was necessary to treat or prevent imminent or life-threatening deterioration. Critical care was time spent personally by me on the following activities: development of treatment plan with patient and/or surrogate as well as nursing, discussions with consultants, evaluation of patient's response to treatment, examination of patient, obtaining history from patient or surrogate, ordering and performing treatments and interventions, ordering and review of laboratory studies, ordering and review of radiographic studies, pulse oximetry and re-evaluation of patient's condition.   MDM   Final diagnoses:  None   The patient's Tylenol level was elevated at 5 hours. He was recommended by poison control to admit the patient and give him Mucomyst. The patient refused to stay so he was committed and admitted to medicine    Bethann BerkshireJoseph Haylee Mcanany, MD 12/12/14 1058

## 2014-12-12 NOTE — ED Notes (Signed)
MD at bedside. 

## 2014-12-12 NOTE — ED Notes (Addendum)
Patient states he does not want to be given the acetylcysteine and states he wants to go home. Estell HarpinZammit, MD made aware and states he will come to bedside to speak to the patient.

## 2014-12-12 NOTE — ED Notes (Signed)
Patient hid his cell phone under his pillow, it rang at which time the sitter at the bedside found it. Patient's cell phone was added to his belongings secured at the desk.

## 2014-12-12 NOTE — ED Notes (Signed)
Pt informed of his lab results including acetaminophen level. Knows he is not to have his belongings at the bedside and he is not allowed to have his cell phone at the bedside. States, "Man I thought I wanted to die but I don't now. There are worse things than a bad birthday." Pt given phone for a phone call. Cursing loudly on the phone. Acknowledges he is to use different language and lower his voice when speaking on the phone. Sitter at bedside. Plan of care explained to patient.

## 2014-12-12 NOTE — H&P (Signed)
[facility] History and Physical  Lou Calamarcus D Sleep ZOX:[id number] DOB: 09/08/1985 DOA: 12/12/2014  Referring physician: Dr. Estell HarpinZammit PCP: No PCP Per Patient   Chief Complaint: elevated tylenol levels  HPI: Daniel Strickland is a 29 y.o. male  Previously healthy 29 year old male who presented after ingesting increase amounts of NSAIDs and acetaminophen. The patient denies any intent to harm himself but ED physician currently feels that patient based on his conversation with patient earlier may have been trying to harm himself. As such patient is currently in the ED and has been IVC'd. Poison control recommends patient be on acetylcysteine for the next 24 hours.    Review of Systems:  Constitutional:  12 point review of systems negative unless otherwise mentioned above  History reviewed. No pertinent past medical history. Past Surgical History  Procedure Laterality Date  . Circumsion      at age 89   Social History:  reports that he has been smoking Cigarettes.  He has been smoking about 0.50 packs per day. He does not have any smokeless tobacco history on file. He reports that he drinks alcohol. He reports that he does not use illicit drugs.  No Known Allergies  Family history - None reported.  Prior to Admission medications   Medication Sig Start Date End Date Taking? Authorizing Provider  acetaminophen (TYLENOL) 500 MG tablet Take 12,500 mg by mouth once.   Yes Historical Provider, MD  ibuprofen (ADVIL,MOTRIN) 800 MG tablet Take 1 tablet (800 mg total) by mouth 3 (three) times daily. Patient not taking: Reported on 12/12/2014 08/01/14   Joycie PeekBenjamin Cartner, PA-C   Physical Exam: Filed Vitals:   12/12/14 0900 12/12/14 0930 12/12/14 1003 12/12/14 1008  BP: 111/63 111/70 160/96   Pulse:   81   Temp:      TempSrc:      Resp: 17 17 16    Height:      Weight:    102.059 kg (225 lb)  SpO2:   100%     Wt Readings from Last 3 Encounters:  12/12/14 102.059 kg (225 lb)    08/01/14 111.358 kg (245 lb 8 oz)  07/25/14 105.688 kg (233 lb)    General:  Appears calm and comfortable Eyes: PERRL, normal lids, irises & conjunctiva ENT: grossly normal hearing, lips & tongue Neck: no LAD, masses or thyromegaly Cardiovascular: RRR, no m/r/g. No LE edema. Telemetry: SR, no arrhythmias  Respiratory: CTA bilaterally, no w/r/r. Normal respiratory effort. Abdomen: soft, ntnd Skin: no rash or induration seen on limited exam Musculoskeletal: grossly normal tone BUE/BLE Psychiatric: grossly normal mood and affect, speech fluent and appropriate Neurologic: grossly non-focal.          Labs on Admission:  Basic Metabolic Panel:  Recent Labs Lab 12/12/14 0602  NA 140  K 4.3  CL 105  CO2 24  GLUCOSE 121*  BUN 8  CREATININE 1.26*  CALCIUM 9.2   Liver Function Tests:  Recent Labs Lab 12/12/14 0602  AST 24  ALT 25  ALKPHOS 73  BILITOT 0.6  PROT 7.7  ALBUMIN 4.4   No results for input(s): LIPASE, AMYLASE in the last 168 hours. No results for input(s): AMMONIA in the last 168 hours. CBC:  Recent Labs Lab 12/12/14 0602  WBC 9.2  HGB 16.7  HCT 48.0  MCV 90.6  PLT 223   Cardiac Enzymes: No results for input(s): CKTOTAL, CKMB, CKMBINDEX, TROPONINI in the last 168 hours.  BNP (last 3 results) No results for input(s): BNP in  the last 8760 hours.  ProBNP (last 3 results) No results for input(s): PROBNP in the last 8760 hours.  CBG:  Recent Labs Lab 12/12/14 0635  GLUCAP 117*    Radiological Exams on Admission: No results found.  EKG: Independently reviewed.  Sinus rhythm with mild ST elevation  Assessment/Plan Active Problems:    Acetaminophen overdose of undetermined intent - Admit to step down unit for closer monitoring - Acetylcysteine per poison control recommendations. - Follow up   Code Status: full DVT Prophylaxis: lovenox Family Communication: discussed directly with patient. Disposition Plan: Pending improvement in  condition.  Time spent: > 35 minutes  Penny Pia [facility] Pager [phone]

## 2014-12-13 DIAGNOSIS — T391X2A Poisoning by 4-Aminophenol derivatives, intentional self-harm, initial encounter: Principal | ICD-10-CM

## 2014-12-13 DIAGNOSIS — T1491 Suicide attempt: Secondary | ICD-10-CM

## 2014-12-13 DIAGNOSIS — T391X4S Poisoning by 4-Aminophenol derivatives, undetermined, sequela: Secondary | ICD-10-CM

## 2014-12-13 LAB — COMPREHENSIVE METABOLIC PANEL
ALK PHOS: 63 U/L (ref 38–126)
ALT: 19 U/L (ref 17–63)
ANION GAP: 5 (ref 5–15)
AST: 16 U/L (ref 15–41)
Albumin: 3.6 g/dL (ref 3.5–5.0)
BILIRUBIN TOTAL: 0.4 mg/dL (ref 0.3–1.2)
BUN: 8 mg/dL (ref 6–20)
CALCIUM: 9.3 mg/dL (ref 8.9–10.3)
CO2: 29 mmol/L (ref 22–32)
CREATININE: 1.25 mg/dL — AB (ref 0.61–1.24)
Chloride: 103 mmol/L (ref 101–111)
GLUCOSE: 88 mg/dL (ref 65–99)
Potassium: 4.1 mmol/L (ref 3.5–5.1)
Sodium: 137 mmol/L (ref 135–145)
Total Protein: 6.9 g/dL (ref 6.5–8.1)

## 2014-12-13 LAB — CBC
HEMATOCRIT: 46.1 % (ref 39.0–52.0)
Hemoglobin: 16.3 g/dL (ref 13.0–17.0)
MCH: 32 pg (ref 26.0–34.0)
MCHC: 35.4 g/dL (ref 30.0–36.0)
MCV: 90.6 fL (ref 78.0–100.0)
Platelets: 174 10*3/uL (ref 150–400)
RBC: 5.09 MIL/uL (ref 4.22–5.81)
RDW: 12.1 % (ref 11.5–15.5)
WBC: 6.8 10*3/uL (ref 4.0–10.5)

## 2014-12-13 LAB — PROTIME-INR
INR: 1.08 (ref 0.00–1.49)
Prothrombin Time: 14.2 seconds (ref 11.6–15.2)

## 2014-12-13 LAB — ACETAMINOPHEN LEVEL

## 2014-12-13 NOTE — Progress Notes (Signed)
   12/13/14 1000  Clinical Encounter Type  Visited With Patient  Visit Type Initial;Psychological support;Spiritual support;Critical Care;Behavioral Health  Referral From Physician  Consult/Referral To Chaplain  Spiritual Encounters  Spiritual Needs Emotional;Other (Comment) (Pastoral Conversation)  Stress Factors  Patient Stress Factors Other (Comment);Loss of control   The Chaplain visited with the patient per consult to Spiritual Care.  The patient was asleep upon arrival, but was easily awoke. Patient was open to Spiritual Care, but stated that he is agnostic.  The patient stated that he wasn't wanting to kill himself, but that he thought about wanting to die in the moment. The patient stated that he thought about sitting in his car and letting the cold kill him; but he changed his mind. Then he took the pills.  Patient states that he had this "episode" due to his birthday party not going as he had planned.  The patient stated that he plans on going back to college in January and changing his major to psychology.  Patient states that he has 7 kids and a girlfriend, and that he feels he has a lot to live for. He continually stated that he did not want to die.  The also stated numerous times that he is wanting to go home today.  Patient is worried about his liver and test results.  Patient stated that he has good family support from his mother and grandmother. Family lives in [place]. The patient would like follow-up care. States he needs somebody to talk to.  Patient hit his arm with the IV in it while Chaplain was in the room. Patient became tearful. Chaplain got permission from the patient's nurse to give the patient a bone pillow to put under that arm to make him more comfortable.  The Chaplain will follow-up with the patient at a later time.   Chaplain Clint BolderBrittany Edynn Gillock M.Div

## 2014-12-13 NOTE — Discharge Summary (Signed)
Physician Discharge Summary  Daniel Strickland BJY:[id number] DOB: Feb 03, 1985 DOA: 12/12/2014  PCP: No PCP Per Patient  Admit date: 12/12/2014 Discharge date: 12/13/2014  Time spent: 35  minutes  Recommendations for Outpatient Follow-up:  1. Please encourage cessation of alcohol or recreational drug use   Discharge Diagnoses:  Active Problems:   Suicidal behavior   Acetaminophen overdose of undetermined intent   Discharge Condition: stable  Diet recommendation: regular diet  Filed Weights   12/12/14 0531 12/12/14 1008 12/12/14 1219  Weight: 136.079 kg (300 lb) 102.059 kg (225 lb) 110.4 kg (243 lb 6.2 oz)    History of present illness:  29 y/o that presented to the hospital after unintentionally taking too much tylenol  Hospital Course: Acetaminophen unintentional overdose - d/c psychiatry who reported that patient was able to be discharged from her standpoint. - Placed on mucomyst successfully.  - liver enzymes trended down - recommended alcohol and recreational drug cessation  Procedures:  none  Consultations:  Psychiatry  Discharge Exam: Filed Vitals:   12/13/14 0740 12/13/14 1143  BP:    Pulse:    Temp: 98.5 F (36.9 C) 98.6 F (37 C)  Resp:      General: Pt in nad, alert and awake Cardiovascular: rrr, no mrg Respiratory: cta bl, no wheezes  Discharge Instructions   Discharge Instructions    Call MD for:  difficulty breathing, headache or visual disturbances    Complete by:  As directed      Call MD for:  persistant dizziness or light-headedness    Complete by:  As directed      Diet - low sodium heart healthy    Complete by:  As directed      Discharge instructions    Complete by:  As directed   Please refrain from any alcohol or recreational drug use     Increase activity slowly    Complete by:  As directed           Current Discharge Medication List    STOP taking these medications     acetaminophen (TYLENOL) 500 MG tablet       ibuprofen (ADVIL,MOTRIN) 800 MG tablet        No Known Allergies    The results of significant diagnostics from this hospitalization (including imaging, microbiology, ancillary and laboratory) are listed below for reference.    Significant Diagnostic Studies: No results found.  Microbiology: Recent Results (from the past 240 hour(s))  MRSA PCR Screening     Status: None   Collection Time: 12/12/14 12:24 PM  Result Value Ref Range Status   MRSA by PCR NEGATIVE NEGATIVE Final    Comment:        The GeneXpert MRSA Assay (FDA approved for NASAL specimens only), is one component of a comprehensive MRSA colonization surveillance program. It is not intended to diagnose MRSA infection nor to guide or monitor treatment for MRSA infections.      Labs: Basic Metabolic Panel:  Recent Labs Lab 12/12/14 0602 12/13/14 0955  NA 140 137  K 4.3 4.1  CL 105 103  CO2 24 29  GLUCOSE 121* 88  BUN 8 8  CREATININE 1.26* 1.25*  CALCIUM 9.2 9.3   Liver Function Tests:  Recent Labs Lab 12/12/14 0602 12/13/14 0955  AST 24 16  ALT 25 19  ALKPHOS 73 63  BILITOT 0.6 0.4  PROT 7.7 6.9  ALBUMIN 4.4 3.6   No results for input(s): LIPASE, AMYLASE in the last 168 hours.  No results for input(s): AMMONIA in the last 168 hours. CBC:  Recent Labs Lab 12/12/14 0602 12/13/14 0955  WBC 9.2 6.8  HGB 16.7 16.3  HCT 48.0 46.1  MCV 90.6 90.6  PLT 223 174   Cardiac Enzymes: No results for input(s): CKTOTAL, CKMB, CKMBINDEX, TROPONINI in the last 168 hours. BNP: BNP (last 3 results) No results for input(s): BNP in the last 8760 hours.  ProBNP (last 3 results) No results for input(s): PROBNP in the last 8760 hours.  CBG:  Recent Labs Lab 12/12/14 0635  GLUCAP 117*       Signed:  Penny Pia  [facility] 12/13/2014, 12:24 PM

## 2014-12-13 NOTE — Progress Notes (Signed)
[place] and informed her of pts labwork.  She stated it was fine to d/c the acetylcysteine.  Erick Blinksuchman, Bevin Mayall D, RN

## 2014-12-13 NOTE — Progress Notes (Signed)
This Clinical research associatewriter spoke with Dr. Cena BentonVega as he requested a psych consult and this was documented on the list for Dr. Tenny Crawoss.     Maryelizabeth Rowanressa Mansel Strother, MSW, Clare CharonLCSW, LCAS [facility] Triage Specialist [phone]

## 2014-12-13 NOTE — Progress Notes (Signed)
Gave pt back his 2 bags of belongings.  Erick Blinksuchman, Aiesha Leland D, RN

## 2014-12-13 NOTE — Consult Note (Signed)
[facility] Face-to-Face Psychiatry Consult   Reason for Consult: Suicide attempt Referring Physician:  Dr. Wendee Beavers Patient Identification: Daniel Strickland MRN:  [id number] Principal Diagnosis: <principal problem not specified> Diagnosis:   Patient Active Problem List   Diagnosis Date Noted  . Suicidal behavior [F48.9] 12/12/2014  . Acetaminophen overdose of undetermined intent [T39.1X4A] 12/12/2014    Total Time spent with patient: 30 minutes  Subjective:   Daniel Strickland is a 29 y.o. male patient admitted with suicide attempt with Tylenol overdose  HPI:  This patient is a 29 year old black male who lives in [place] with his girlfriend and 3 of his 7 children. He is currently unemployed. He states that lately he's been having conflicts with his sister. She's been putting him down and telling him to leave his girlfriend and not to return to college like he plans. His birthday was last Sunday and she upset him on that day. They had another argument on the day of admission. After his family went to bed he admits that he has been smoking marijuana. He got upset with his sisters, aunts and took about 15 tablets of ibuprofen. He admits that he smokes marijuana daily basis.  After doing this he immediately regretted it and called the EMS. He was evaluated in the ER and put under involuntary petition. The patient adamantly denies suicidal ideation today. He has never made these attempts before. He's had no prior history of treatment for depression or psychiatric illness. He denies auditory or visual hallucinations or any thoughts of hurting others or paranoia. He adamantly denied using cocaine despite showing up on his drug screen along with marijuana. He agrees that these drugs probably altered his judgment on the day of admission.  Past Psychiatric History: none  Risk to Self: Suicidal Ideation: Yes-Currently Present Suicidal Intent: Yes-Currently Present Is patient at risk for suicide?:  Yes Suicidal Plan?: Yes-Currently Present Specify Current Suicidal Plan:  (overdose) Access to Means: Yes Specify Access to Suicidal Means:  (access to medications and RX's) What has been your use of drugs/alcohol within the last 12 months?:  (patient reports drinking alcohol 2x's per week (binge)) How many times?:  (Patient sts, "Alot".. and laughs. ) Other Self Harm Risks:  (none reported) Triggers for Past Attempts:  (patient refused to disclose) Intentional Self Injurious Behavior: None Risk to Others: Homicidal Ideation: No Thoughts of Harm to Others: No Current Homicidal Intent: No Current Homicidal Plan: No Access to Homicidal Means: No Identified Victim:  (n/a) History of harm to others?: No Assessment of Violence: None Noted Violent Behavior Description:  (patient is calm and cooperative ) Does patient have access to weapons?: No Criminal Charges Pending?: No Does patient have a court date: No Prior Inpatient Therapy: Prior Inpatient Therapy: Yes Prior Therapy Dates:  ("yrs ago") Prior Therapy Facilty/Provider(s):  ([facility] in [place], [place]) Reason for Treatment:  (depression ) Prior Outpatient Therapy: Prior Outpatient Therapy: No Prior Therapy Dates:  (n/a) Prior Therapy Facilty/Provider(s):  (n/a) Reason for Treatment:  (n/a) Does patient have an ACCT team?: No Does patient have Intensive In-House Services?  : No Does patient have [facility] services? : No Does patient have P4CC services?: No  Past Medical History: History reviewed. No pertinent past medical history.  Past Surgical History  Procedure Laterality Date  . Circumsion      at age 27   Family History: History reviewed. No pertinent family history. Family Psychiatric  History: none Social History:  History  Alcohol Use  . Yes  Comment: Drinks weekends     History  Drug Use No    Social History   Social History  . Marital Status: Single    Spouse Name: N/A  . Number of Children: N/A  .  Years of Education: N/A   Social History Main Topics  . Smoking status: Current Every Day Smoker -- 0.50 packs/day    Types: Cigarettes  . Smokeless tobacco: None     Comment: Smokea 1-2 cigarettes a week  . Alcohol Use: Yes     Comment: Drinks weekends  . Drug Use: No  . Sexual Activity: Not Asked   Other Topics Concern  . None   Social History Narrative   Additional Social History:    Pain Medications: SEE MAR Prescriptions: SEE MAR Over the Counter: SEE MAR History of alcohol / drug use?: Yes Name of Substance 1: Alcohol  1 - Age of First Use: "I don't even know" 1 - Amount (size/oz): "I turn up" and "I get very drunk ...isn't that the point" 1 - Frequency: 2x's per week 1 - Duration: on-going  1 - Last Use / Amount: "This morning"                   Allergies:  No Known Allergies  Labs:  Results for orders placed or performed during the hospital encounter of 12/12/14 (from the past 48 hour(s))  Urine rapid drug screen (hosp performed) (Not at [facility])     Status: Abnormal   Collection Time: 12/12/14  6:00 AM  Result Value Ref Range   Opiates NONE DETECTED NONE DETECTED   Cocaine POSITIVE (A) NONE DETECTED   Benzodiazepines NONE DETECTED NONE DETECTED   Amphetamines NONE DETECTED NONE DETECTED   Tetrahydrocannabinol POSITIVE (A) NONE DETECTED   Barbiturates NONE DETECTED NONE DETECTED    Comment:        DRUG SCREEN FOR MEDICAL PURPOSES ONLY.  IF CONFIRMATION IS NEEDED FOR ANY PURPOSE, NOTIFY LAB WITHIN 5 DAYS.        LOWEST DETECTABLE LIMITS FOR URINE DRUG SCREEN Drug Class       Cutoff (ng/mL) Amphetamine      1000 Barbiturate      200 Benzodiazepine   253 Tricyclics       664 Opiates          300 Cocaine          300 THC              50   Comprehensive metabolic panel     Status: Abnormal   Collection Time: 12/12/14  6:02 AM  Result Value Ref Range   Sodium 140 135 - 145 mmol/L   Potassium 4.3 3.5 - 5.1 mmol/L   Chloride 105 101 - 111 mmol/L    CO2 24 22 - 32 mmol/L   Glucose, Bld 121 (H) 65 - 99 mg/dL   BUN 8 6 - 20 mg/dL   Creatinine, Ser 1.26 (H) 0.61 - 1.24 mg/dL   Calcium 9.2 8.9 - 10.3 mg/dL   Total Protein 7.7 6.5 - 8.1 g/dL   Albumin 4.4 3.5 - 5.0 g/dL   AST 24 15 - 41 U/L   ALT 25 17 - 63 U/L   Alkaline Phosphatase 73 38 - 126 U/L   Total Bilirubin 0.6 0.3 - 1.2 mg/dL   GFR calc non Af Amer >60 >60 mL/min   GFR calc Af Amer >60 >60 mL/min    Comment: (NOTE) The eGFR has been calculated  using the CKD EPI equation. This calculation has not been validated in all clinical situations. eGFR's persistently <60 mL/min signify possible Chronic Kidney Disease.    Anion gap 11 5 - 15  Ethanol (ETOH)     Status: Abnormal   Collection Time: 12/12/14  6:02 AM  Result Value Ref Range   Alcohol, Ethyl (B) 214 (H) <5 mg/dL    Comment:        LOWEST DETECTABLE LIMIT FOR SERUM ALCOHOL IS 5 mg/dL FOR MEDICAL PURPOSES ONLY   Salicylate level     Status: None   Collection Time: 12/12/14  6:02 AM  Result Value Ref Range   Salicylate Lvl <2.5 2.8 - 30.0 mg/dL  Acetaminophen level     Status: Abnormal   Collection Time: 12/12/14  6:02 AM  Result Value Ref Range   Acetaminophen (Tylenol), Serum 109 (H) 10 - 30 ug/mL    Comment:        THERAPEUTIC CONCENTRATIONS VARY SIGNIFICANTLY. A RANGE OF 10-30 ug/mL MAY BE AN EFFECTIVE CONCENTRATION FOR MANY PATIENTS. HOWEVER, SOME ARE BEST TREATED AT CONCENTRATIONS OUTSIDE THIS RANGE. ACETAMINOPHEN CONCENTRATIONS >150 ug/mL AT 4 HOURS AFTER INGESTION AND >50 ug/mL AT 12 HOURS AFTER INGESTION ARE OFTEN ASSOCIATED WITH TOXIC REACTIONS.   CBC     Status: None   Collection Time: 12/12/14  6:02 AM  Result Value Ref Range   WBC 9.2 4.0 - 10.5 K/uL   RBC 5.30 4.22 - 5.81 MIL/uL   Hemoglobin 16.7 13.0 - 17.0 g/dL   HCT 48.0 39.0 - 52.0 %   MCV 90.6 78.0 - 100.0 fL   MCH 31.5 26.0 - 34.0 pg   MCHC 34.8 30.0 - 36.0 g/dL   RDW 12.4 11.5 - 15.5 %   Platelets 223 150 - 400 K/uL   CBG monitoring, ED     Status: Abnormal   Collection Time: 12/12/14  6:35 AM  Result Value Ref Range   Glucose-Capillary 117 (H) 65 - 99 mg/dL  MRSA PCR Screening     Status: None   Collection Time: 12/12/14 12:24 PM  Result Value Ref Range   MRSA by PCR NEGATIVE NEGATIVE    Comment:        The GeneXpert MRSA Assay (FDA approved for NASAL specimens only), is one component of a comprehensive MRSA colonization surveillance program. It is not intended to diagnose MRSA infection nor to guide or monitor treatment for MRSA infections.   Acetaminophen level     Status: Abnormal   Collection Time: 12/13/14  9:55 AM  Result Value Ref Range   Acetaminophen (Tylenol), Serum <10 (L) 10 - 30 ug/mL    Comment:        THERAPEUTIC CONCENTRATIONS VARY SIGNIFICANTLY. A RANGE OF 10-30 ug/mL MAY BE AN EFFECTIVE CONCENTRATION FOR MANY PATIENTS. HOWEVER, SOME ARE BEST TREATED AT CONCENTRATIONS OUTSIDE THIS RANGE. ACETAMINOPHEN CONCENTRATIONS >150 ug/mL AT 4 HOURS AFTER INGESTION AND >50 ug/mL AT 12 HOURS AFTER INGESTION ARE OFTEN ASSOCIATED WITH TOXIC REACTIONS.   CBC     Status: None   Collection Time: 12/13/14  9:55 AM  Result Value Ref Range   WBC 6.8 4.0 - 10.5 K/uL   RBC 5.09 4.22 - 5.81 MIL/uL   Hemoglobin 16.3 13.0 - 17.0 g/dL   HCT 46.1 39.0 - 52.0 %   MCV 90.6 78.0 - 100.0 fL   MCH 32.0 26.0 - 34.0 pg   MCHC 35.4 30.0 - 36.0 g/dL   RDW 12.1 11.5 - 15.5 %  Platelets 174 150 - 400 K/uL  Comprehensive metabolic panel     Status: Abnormal   Collection Time: 12/13/14  9:55 AM  Result Value Ref Range   Sodium 137 135 - 145 mmol/L   Potassium 4.1 3.5 - 5.1 mmol/L   Chloride 103 101 - 111 mmol/L   CO2 29 22 - 32 mmol/L   Glucose, Bld 88 65 - 99 mg/dL   BUN 8 6 - 20 mg/dL   Creatinine, Ser 1.25 (H) 0.61 - 1.24 mg/dL   Calcium 9.3 8.9 - 10.3 mg/dL   Total Protein 6.9 6.5 - 8.1 g/dL   Albumin 3.6 3.5 - 5.0 g/dL   AST 16 15 - 41 U/L   ALT 19 17 - 63 U/L   Alkaline  Phosphatase 63 38 - 126 U/L   Total Bilirubin 0.4 0.3 - 1.2 mg/dL   GFR calc non Af Amer >60 >60 mL/min   GFR calc Af Amer >60 >60 mL/min    Comment: (NOTE) The eGFR has been calculated using the CKD EPI equation. This calculation has not been validated in all clinical situations. eGFR's persistently <60 mL/min signify possible Chronic Kidney Disease.    Anion gap 5 5 - 15  Protime-INR     Status: None   Collection Time: 12/13/14  9:55 AM  Result Value Ref Range   Prothrombin Time 14.2 11.6 - 15.2 seconds   INR 1.08 0.00 - 1.49    Current Facility-Administered Medications  Medication Dose Route Frequency Provider Last Rate Last Dose  . 0.9 %  sodium chloride infusion  250 mL Intravenous PRN Velvet Bathe, MD      . enoxaparin (LOVENOX) injection 55 mg  0.5 mg/kg Subcutaneous Q24H Velvet Bathe, MD   55 mg at 12/12/14 1434  . ondansetron (ZOFRAN) injection 4 mg  4 mg Intravenous Q6H PRN Velvet Bathe, MD   4 mg at 12/12/14 1321  . sodium chloride 0.9 % injection 3 mL  3 mL Intravenous Q12H Velvet Bathe, MD   3 mL at 12/12/14 1437  . sodium chloride 0.9 % injection 3 mL  3 mL Intravenous Q12H Velvet Bathe, MD   3 mL at 12/12/14 1436  . sodium chloride 0.9 % injection 3 mL  3 mL Intravenous PRN Velvet Bathe, MD        Musculoskeletal: Strength & Muscle Tone: within normal limits Gait & Station: normal Patient leans: N/A  Psychiatric Specialty Exam: Review of Systems  Psychiatric/Behavioral: Positive for depression.  All other systems reviewed and are negative.   Blood pressure 145/71, pulse 80, temperature 98.5 F (36.9 C), temperature source Oral, resp. rate 17, height 5' 11"  (1.803 m), weight 110.4 kg (243 lb 6.2 oz), SpO2 98 %.Body mass index is 33.96 kg/(m^2).  General Appearance: Casual and Fairly Groomed  Engineer, water::  Good  Speech:  Clear and Coherent  Volume:  Normal  Mood:  Euthymic  Affect:  Full Range  Thought Process:  Goal Directed  Orientation:  Full (Time,  Place, and Person)  Thought Content:  Rumination  Suicidal Thoughts:  No  Homicidal Thoughts:  No  Memory:  Immediate;   Good Recent;   Fair Remote;   Fair  Judgement:  Poor  Insight:  Lacking  Psychomotor Activity:  Normal  Concentration:  Good  Recall:  Good  Fund of Knowledge:Good  Language: Good  Akathisia:  No  Handed:  Right  AIMS (if indicated):     Assets:  Communication Skills Desire for Improvement  Resilience Social Support  ADL's:  Intact  Cognition: WNL  Sleep:      Treatment Plan Summary: Plan Patient no longer meets criteria for involuntary commitment and can be released home.  Disposition: No evidence of imminent risk to self or others at present.   Patient does not meet criteria for psychiatric inpatient admission. Discussed crisis plan, support from social network, calling 911, coming to the Emergency Department, and calling Suicide Hotline.  Ambreen Tufte, [facility] 12/13/2014 11:04 AM

## 2017-04-08 ENCOUNTER — Emergency Department (HOSPITAL_COMMUNITY): Payer: Self-pay

## 2017-04-08 ENCOUNTER — Encounter (HOSPITAL_COMMUNITY): Payer: Self-pay | Admitting: Emergency Medicine

## 2017-04-08 ENCOUNTER — Emergency Department (HOSPITAL_COMMUNITY)
Admission: EM | Admit: 2017-04-08 | Discharge: 2017-04-09 | Disposition: A | Discharge status: Home / Self Care | Payer: Self-pay | Source: Home / Self Care | Attending: Emergency Medicine | Admitting: Emergency Medicine

## 2017-04-08 DIAGNOSIS — F17210 Nicotine dependence, cigarettes, uncomplicated: Secondary | ICD-10-CM | POA: Insufficient documentation

## 2017-04-08 DIAGNOSIS — Y990 Civilian activity done for income or pay: Secondary | ICD-10-CM

## 2017-04-08 DIAGNOSIS — Z23 Encounter for immunization: Secondary | ICD-10-CM

## 2017-04-08 DIAGNOSIS — Y9259 Other trade areas as the place of occurrence of the external cause: Secondary | ICD-10-CM

## 2017-04-08 DIAGNOSIS — S61216A Laceration without foreign body of right little finger without damage to nail, initial encounter: Secondary | ICD-10-CM

## 2017-04-08 DIAGNOSIS — T23039A Burn of unspecified degree of unspecified multiple fingers (nail), not including thumb, initial encounter: Secondary | ICD-10-CM

## 2017-04-08 DIAGNOSIS — S6721XA Crushing injury of right hand, initial encounter: Secondary | ICD-10-CM

## 2017-04-08 DIAGNOSIS — W230XXA Caught, crushed, jammed, or pinched between moving objects, initial encounter: Secondary | ICD-10-CM | POA: Insufficient documentation

## 2017-04-08 DIAGNOSIS — S61219A Laceration without foreign body of unspecified finger without damage to nail, initial encounter: Secondary | ICD-10-CM

## 2017-04-08 DIAGNOSIS — Y939 Activity, unspecified: Secondary | ICD-10-CM | POA: Insufficient documentation

## 2017-04-08 DIAGNOSIS — I10 Essential (primary) hypertension: Secondary | ICD-10-CM

## 2017-04-08 DIAGNOSIS — T300 Burn of unspecified body region, unspecified degree: Secondary | ICD-10-CM

## 2017-04-08 DIAGNOSIS — S61214A Laceration without foreign body of right ring finger without damage to nail, initial encounter: Secondary | ICD-10-CM

## 2017-04-08 HISTORY — DX: Essential (primary) hypertension: I10

## 2017-04-08 HISTORY — DX: Burn of unspecified degree of unspecified multiple fingers (nail), not including thumb, initial encounter: T23.039A

## 2017-04-08 MED ORDER — MORPHINE SULFATE 15 MG PO TABS: 15.0000 mg | ORAL_TABLET | ORAL | Status: DC | PRN

## 2017-04-08 MED ORDER — CEFAZOLIN SODIUM-DEXTROSE 1-4 GM/50ML-% IV SOLN
1.0000 g | Freq: Once | INTRAVENOUS | Status: AC
  Administered 2017-04-09: 1 g via INTRAVENOUS
  Filled 2017-04-08: qty 50

## 2017-04-08 MED ORDER — KETAMINE HCL 10 MG/ML IJ SOLN
0.3000 mg/kg | Freq: Once | INTRAMUSCULAR | Status: AC
  Administered 2017-04-09: 33 mg via INTRAVENOUS
  Filled 2017-04-08: qty 1

## 2017-04-08 MED ORDER — HYDROMORPHONE HCL 1 MG/ML IJ SOLN
1.0000 mg | Freq: Once | INTRAMUSCULAR | Status: AC
  Administered 2017-04-08: 1 mg via INTRAVENOUS
  Filled 2017-04-08: qty 1

## 2017-04-08 MED ORDER — TETANUS-DIPHTH-ACELL PERTUSSIS 5-2.5-18.5 LF-MCG/0.5 IM SUSP
0.5000 mL | Freq: Once | INTRAMUSCULAR | Status: AC
  Administered 2017-04-08: 0.5 mL via INTRAMUSCULAR
  Filled 2017-04-08: qty 0.5

## 2017-04-08 NOTE — ED Notes (Signed)
Pt taken to xray 

## 2017-04-08 NOTE — ED Triage Notes (Addendum)
Pt to ED via [facility] after reported being at work and his right hand was caught in a machine.  Pt has crush injury with lac's to right 4th and 5th fingers.  Pt was given Fentanyl IV en route

## 2017-04-08 NOTE — ED Provider Notes (Signed)
[facility] EMERGENCY DEPARTMENT Provider Note   CSN: [id number] Arrival date & time: 04/08/17  2213     History   Chief Complaint Chief Complaint  Patient presents with  . Hand Injury    HPI Daniel Strickland is a 32 y.o. male.  The history is provided by the patient and the EMS personnel.  Hand Injury   The incident occurred less than 1 hour ago. The incident occurred at work. Injury mechanism: L hand caught in cardboard roller. The pain is present in the left fingers. The quality of the pain is described as aching. The pain is at a severity of 10/10. The pain is severe. The pain has been improving (got total of of fentanyl with EMS) since the incident. Pertinent negatives include no fever.  -Pt is right handed  Past Medical History:  Diagnosis Date  . Hypertension     Patient Active Problem List   Diagnosis Date Noted  . Suicidal behavior 12/12/2014  . Acetaminophen overdose of undetermined intent 12/12/2014    Past Surgical History:  Procedure Laterality Date  . Circumsion     at age 32       Home Medications    Prior to Admission medications   Medication Sig Start Date End Date Taking? Authorizing Provider  doxycycline (VIBRAMYCIN) 100 MG capsule Take 1 capsule (100 mg total) by mouth 2 (two) times daily for 7 days. 04/09/17 04/16/17  Dwana Melena, DO  oxyCODONE-acetaminophen (PERCOCET/ROXICET) 5-325 MG tablet Take 1 tablet by mouth every 4 (four) hours as needed for up to 1 day for moderate pain or severe pain. 04/09/17 04/10/17  Dwana Melena, DO    Family History No family history on file.  Social History Social History   Tobacco Use  . Smoking status: Current Every Day Smoker    Packs/day: 0.50    Types: Cigarettes  . Tobacco comment: Smokea 1-2 cigarettes a week  Substance Use Topics  . Alcohol use: Yes    Comment: Drinks weekends  . Drug use: No     Allergies   Patient has no known allergies.   Review of  Systems Review of Systems  Constitutional: Negative for fever.  Eyes: Negative for visual disturbance.  Respiratory: Negative for shortness of breath.   Cardiovascular: Negative for chest pain.  Gastrointestinal: Negative for abdominal pain, nausea and vomiting.  Musculoskeletal: Negative for back pain and neck pain.  Skin: Positive for wound.  Neurological: Positive for numbness (L fingers).  All other systems reviewed and are negative.    Physical Exam Updated Vital Signs BP (!) 168/97   Pulse (!) 104   Resp 18   Ht 6' (1.829 m)   Wt 109.8 kg (242 lb)   SpO2 97%   BMI 32.82 kg/m   Physical Exam  Constitutional: He appears well-developed and well-nourished.  HENT:  Head: Normocephalic and atraumatic.  Eyes: Conjunctivae are normal.  Neck: Neck supple.  Cardiovascular: Normal rate, regular rhythm and intact distal pulses.  No murmur heard. Pulmonary/Chest: Effort normal and breath sounds normal. No respiratory distress.  Abdominal: Soft. There is no tenderness.  Musculoskeletal: He exhibits no edema.       Left wrist: He exhibits no tenderness.       Hands: Superficial degloving injury to 3rd and 4th digits on L. Diminished sensation 4th and 5th digits. Intact capillary refill.  Neurological: He is alert.  Skin: Skin is warm and dry.  Psychiatric: He has a normal mood and  affect.  Nursing note and vitals reviewed.    ED Treatments / Results  Labs (all labs ordered are listed, but only abnormal results are displayed) Labs Reviewed  CBC WITH DIFFERENTIAL/PLATELET - Abnormal; Notable for the following components:      Result Value   WBC 11.2 (*)    Neutro Abs 8.0 (*)    All other components within normal limits  BASIC METABOLIC PANEL - Abnormal; Notable for the following components:   CO2 20 (*)    Glucose, Bld 124 (*)    Calcium 8.8 (*)    All other components within normal limits    EKG  EKG Interpretation None       Radiology Dg Hand Complete  Right  Result Date: 04/08/2017 CLINICAL DATA:  Crush injury to the right hand. Hand got caught in a machine at work. EXAM: RIGHT HAND - COMPLETE 3+ VIEW COMPARISON:  None. FINDINGS: Lateral view significantly limited due to positioning and osseous overlap. A dressing overlies the fourth and fifth digits. Skin irregularity and minimal air in the soft tissues about the ulnar 4 hours and mid fifth digit. No radiopaque foreign body. No evidence of acute fracture. Soft tissue edema overlies the dorsal metacarpals. IMPRESSION: Soft tissue injury to the fourth and fifth digits with overlying dressing in place. No radiopaque foreign body or acute fracture. Electronically Signed   By: Rubye OaksMelanie  Ehinger M.D.   On: 04/08/2017 23:53    Procedures .Marland Kitchen.Laceration Repair Date/Time: 04/09/2017 1:28 AM Performed by: Dwana Melenaong, Heidy Mccubbin, DO Authorized by: Maia PlanLong, Joshua G, MD   Consent:    Consent obtained:  Verbal   Consent given by:  Patient Anesthesia (see MAR for exact dosages):    Anesthesia method:  Nerve block   Block location:  4th and 5th right digits   Block needle gauge:  25 G   Block anesthetic:  Lidocaine 1% w/o epi   Block injection procedure:  Introduced needle, negative aspiration for blood and incremental injection   Block outcome:  Anesthesia achieved Laceration details:    Location:  Finger   Finger location: R small and 4th digits.   Wound length (cm): R 4th dorsum of digit (2cm), R 5th volar digit (3cm)   Depth (mm):  8 Repair type:    Repair type:  Intermediate Exploration:    Wound extent: no foreign bodies/material noted     Wound extent comment:  Tendon exposed Treatment:    Area cleansed with:  Saline   Amount of cleaning:  Extensive   Irrigation method:  Syringe   Visualized foreign bodies/material removed: no   Skin repair:    Repair method:  Sutures   Suture size:  4-0   Wound skin closure material used: chromic gut x1 (deep horizontal in 5th digit), prolene for simple interrupted  and vertical mattress.   Suture technique:  Simple interrupted, horizontal mattress and vertical mattress   Number of sutures:  5 (1 buried horizontal mattress, 1 simple interrupted, 3 vertical mattress) Approximation:    Approximation:  Loose Post-procedure details:    Dressing:  Non-adherent dressing and sterile dressing (kerlix)   Patient tolerance of procedure:  Tolerated well, no immediate complications Comments:     Devitalized skin was debrided which revealed third-degree burns along the dorsum of his third and fourth proximal digits.   (including critical care time)  Medications Ordered in ED Medications  oxyCODONE-acetaminophen (PERCOCET/ROXICET) 5-325 MG per tablet 2 tablet (has no administration in time range)  Tdap (BOOSTRIX) injection 0.5  mL (0.5 mLs Intramuscular Given 04/08/17 2250)  ceFAZolin (ANCEF) IVPB 1 g/50 mL premix (0 g Intravenous Stopped 04/09/17 0059)  HYDROmorphone (DILAUDID) injection 1 mg (1 mg Intravenous Given 04/08/17 2250)  ketamine (KETALAR) injection 33 mg (33 mg Intravenous Given 04/09/17 0021)  lidocaine (PF) (XYLOCAINE) 1 % injection 10 mL (10 mLs Intradermal Given by Other 04/09/17 0023)     Initial Impression / Assessment and Plan / ED Course  I have reviewed the triage vital signs and the nursing notes.  Pertinent labs & imaging results that were available during my care of the patient were reviewed by me and considered in my medical decision making (see chart for details).     Patient is a 32 year old male with history of hypertension who presents for evaluation of right hand injury.  Patient's hand got caught in a cardboard roller and has deep lacerations to the right fourth and fifth digits and with sloughing of skin across the dorsum at the level of the MCP and distal.    On exam he has capillary refill present and all his affected digits.  He has a laceration with flexor tendon exposed in the fifth digit and extensor tendon exposed in the  fourth digit.  Devitalized skin on the dorsum of his fingers is debrided which exposed third-degree burns on portions of the dorsum of his third and fourth digit.  I spoke with Dr. Merlyn Lot, orthopedic hand surgeon, and he recommended copious irrigation and loose closure and follow-up in his clinic at 9 AM on Friday.  Patient's tetanus is updated, he received 1 g of Ancef.  He received 250 mcg of fentanyl prior to arrival and was given 1 mg of Dilaudid here with still continued significant pain.  Patient received pain dose ketamine with great analgesic effect, however he did have the expected side effects of ketamine including hallucinations.  Procedure noted above of the laceration repair.  His digits are very swollen and are difficult to close.  I approximated the wounds the best I could.  Wounds were all hemostatic.  Bulky dressing applied.  Patient given 10 mg of Percocet prior to discharge.  A prescription for 1 day of Percocet given q. 4hour.  Prescription for doxycycline is given.  Dr. Sondra Come recommended either Bactrim or doxycycline.  Patient is to be n.p.o. after 5 AM for possible OR in the morning.  Final Clinical Impressions(s) / ED Diagnoses   Final diagnoses:  Crushing injury of right hand, initial encounter  Laceration of finger of right hand without foreign body without damage to nail, unspecified finger, initial encounter  Third degree burn    ED Discharge Orders        Ordered    doxycycline (VIBRAMYCIN) 100 MG capsule  2 times daily     04/09/17 0127    oxyCODONE-acetaminophen (PERCOCET/ROXICET) 5-325 MG tablet  Every 4 hours PRN     04/09/17 0127       Dwana Melena, DO 04/09/17 0137    Long, Arlyss Repress, MD 04/09/17 [phone]

## 2017-04-09 ENCOUNTER — Encounter (HOSPITAL_BASED_OUTPATIENT_CLINIC_OR_DEPARTMENT_OTHER): Payer: Self-pay | Admitting: Anesthesiology

## 2017-04-09 ENCOUNTER — Ambulatory Visit (HOSPITAL_BASED_OUTPATIENT_CLINIC_OR_DEPARTMENT_OTHER): Payer: Self-pay | Admitting: Anesthesiology

## 2017-04-09 ENCOUNTER — Encounter (HOSPITAL_BASED_OUTPATIENT_CLINIC_OR_DEPARTMENT_OTHER)
Admission: RE | Disposition: A | Discharge status: Home / Self Care | Payer: Self-pay | Source: Ambulatory Visit | Attending: Orthopedic Surgery

## 2017-04-09 ENCOUNTER — Other Ambulatory Visit: Payer: Self-pay

## 2017-04-09 ENCOUNTER — Ambulatory Visit (HOSPITAL_BASED_OUTPATIENT_CLINIC_OR_DEPARTMENT_OTHER)
Admission: RE | Admit: 2017-04-09 | Discharge: 2017-04-09 | Disposition: A | Discharge status: Home / Self Care | Payer: Self-pay | Source: Ambulatory Visit | Attending: Orthopedic Surgery | Admitting: Orthopedic Surgery

## 2017-04-09 ENCOUNTER — Other Ambulatory Visit: Payer: Self-pay | Admitting: Orthopedic Surgery

## 2017-04-09 DIAGNOSIS — S62636A Displaced fracture of distal phalanx of right little finger, initial encounter for closed fracture: Secondary | ICD-10-CM | POA: Insufficient documentation

## 2017-04-09 DIAGNOSIS — S67196A Crushing injury of right little finger, initial encounter: Secondary | ICD-10-CM | POA: Insufficient documentation

## 2017-04-09 DIAGNOSIS — Y990 Civilian activity done for income or pay: Secondary | ICD-10-CM | POA: Insufficient documentation

## 2017-04-09 DIAGNOSIS — F17210 Nicotine dependence, cigarettes, uncomplicated: Secondary | ICD-10-CM | POA: Insufficient documentation

## 2017-04-09 DIAGNOSIS — X17XXXA Contact with hot engines, machinery and tools, initial encounter: Secondary | ICD-10-CM | POA: Insufficient documentation

## 2017-04-09 DIAGNOSIS — S61212A Laceration without foreign body of right middle finger without damage to nail, initial encounter: Secondary | ICD-10-CM | POA: Insufficient documentation

## 2017-04-09 DIAGNOSIS — S67194A Crushing injury of right ring finger, initial encounter: Secondary | ICD-10-CM | POA: Insufficient documentation

## 2017-04-09 DIAGNOSIS — S6401XA Injury of ulnar nerve at wrist and hand level of right arm, initial encounter: Secondary | ICD-10-CM | POA: Insufficient documentation

## 2017-04-09 DIAGNOSIS — S67192A Crushing injury of right middle finger, initial encounter: Secondary | ICD-10-CM | POA: Insufficient documentation

## 2017-04-09 DIAGNOSIS — T310 Burns involving less than 10% of body surface: Secondary | ICD-10-CM | POA: Insufficient documentation

## 2017-04-09 DIAGNOSIS — Z8249 Family history of ischemic heart disease and other diseases of the circulatory system: Secondary | ICD-10-CM | POA: Insufficient documentation

## 2017-04-09 DIAGNOSIS — I10 Essential (primary) hypertension: Secondary | ICD-10-CM | POA: Insufficient documentation

## 2017-04-09 DIAGNOSIS — S66126A Laceration of flexor muscle, fascia and tendon of right little finger at wrist and hand level, initial encounter: Secondary | ICD-10-CM | POA: Insufficient documentation

## 2017-04-09 DIAGNOSIS — T23031A Burn of unspecified degree of multiple right fingers (nail), not including thumb, initial encounter: Secondary | ICD-10-CM | POA: Insufficient documentation

## 2017-04-09 HISTORY — PX: NERVE, TENDON AND ARTERY REPAIR: SHX5695

## 2017-04-09 LAB — BASIC METABOLIC PANEL
ANION GAP: 8 (ref 5–15)
BUN: 14 mg/dL (ref 6–20)
CALCIUM: 8.8 mg/dL — AB (ref 8.9–10.3)
CO2: 20 mmol/L — ABNORMAL LOW (ref 22–32)
Chloride: 108 mmol/L (ref 101–111)
Creatinine, Ser: 0.99 mg/dL (ref 0.61–1.24)
Glucose, Bld: 124 mg/dL — ABNORMAL HIGH (ref 65–99)
Potassium: 3.7 mmol/L (ref 3.5–5.1)
SODIUM: 136 mmol/L (ref 135–145)

## 2017-04-09 LAB — CBC WITH DIFFERENTIAL/PLATELET
BASOS ABS: 0 10*3/uL (ref 0.0–0.1)
BASOS PCT: 0 %
Eosinophils Absolute: 0.1 10*3/uL (ref 0.0–0.7)
Eosinophils Relative: 1 %
HEMATOCRIT: 46.1 % (ref 39.0–52.0)
Hemoglobin: 16 g/dL (ref 13.0–17.0)
Lymphocytes Relative: 23 %
Lymphs Abs: 2.6 10*3/uL (ref 0.7–4.0)
MCH: 31.1 pg (ref 26.0–34.0)
MCHC: 34.7 g/dL (ref 30.0–36.0)
MCV: 89.7 fL (ref 78.0–100.0)
MONO ABS: 0.5 10*3/uL (ref 0.1–1.0)
Monocytes Relative: 5 %
NEUTROS ABS: 8 10*3/uL — AB (ref 1.7–7.7)
Neutrophils Relative %: 71 %
PLATELETS: 190 10*3/uL (ref 150–400)
RBC: 5.14 MIL/uL (ref 4.22–5.81)
RDW: 12.8 % (ref 11.5–15.5)
WBC: 11.2 10*3/uL — ABNORMAL HIGH (ref 4.0–10.5)

## 2017-04-09 SURGERY — NERVE, TENDON AND ARTERY REPAIR
Anesthesia: Monitor Anesthesia Care | Site: Hand | Laterality: Right

## 2017-04-09 MED ORDER — FENTANYL CITRATE (PF) 100 MCG/2ML IJ SOLN
50.0000 ug | INTRAMUSCULAR | Status: DC | PRN
  Administered 2017-04-09: 100 ug via INTRAVENOUS

## 2017-04-09 MED ORDER — ONDANSETRON HCL 4 MG/2ML IJ SOLN
4.0000 mg | Freq: Once | INTRAMUSCULAR | Status: AC | PRN
  Administered 2017-04-09: 4 mg via INTRAVENOUS

## 2017-04-09 MED ORDER — ONDANSETRON HCL 4 MG/2ML IJ SOLN
INTRAMUSCULAR | Status: AC
  Filled 2017-04-09: qty 2

## 2017-04-09 MED ORDER — HEPARIN SODIUM (PORCINE) 1000 UNIT/ML IJ SOLN
INTRAMUSCULAR | Status: AC
  Filled 2017-04-09: qty 1

## 2017-04-09 MED ORDER — MIDAZOLAM HCL 2 MG/2ML IJ SOLN
INTRAMUSCULAR | Status: AC
  Filled 2017-04-09: qty 2

## 2017-04-09 MED ORDER — SILVER SULFADIAZINE 1 % EX CREA
TOPICAL_CREAM | CUTANEOUS | Status: AC
  Filled 2017-04-09: qty 85

## 2017-04-09 MED ORDER — LIDOCAINE HCL (PF) 1 % IJ SOLN
10.0000 mL | Freq: Once | INTRAMUSCULAR | Status: AC
  Administered 2017-04-09: 10 mL via INTRADERMAL
  Filled 2017-04-09: qty 10

## 2017-04-09 MED ORDER — FENTANYL CITRATE (PF) 100 MCG/2ML IJ SOLN
INTRAMUSCULAR | Status: AC
  Filled 2017-04-09: qty 2

## 2017-04-09 MED ORDER — PHENYLEPHRINE 40 MCG/ML (10ML) SYRINGE FOR IV PUSH (FOR BLOOD PRESSURE SUPPORT)
PREFILLED_SYRINGE | INTRAVENOUS | Status: AC
  Filled 2017-04-09: qty 10

## 2017-04-09 MED ORDER — HYDROMORPHONE HCL 1 MG/ML IJ SOLN: 0.2500 mg | INTRAMUSCULAR | Status: DC | PRN

## 2017-04-09 MED ORDER — PROPOFOL 500 MG/50ML IV EMUL
INTRAVENOUS | Status: AC
  Filled 2017-04-09: qty 50

## 2017-04-09 MED ORDER — PROPOFOL 10 MG/ML IV BOLUS
INTRAVENOUS | Status: DC | PRN
  Administered 2017-04-09: 20 mg via INTRAVENOUS

## 2017-04-09 MED ORDER — DOXYCYCLINE HYCLATE 100 MG PO CAPS: 100.0000 mg | ORAL_CAPSULE | Freq: Two times a day (BID) | ORAL | 0 refills | Status: AC

## 2017-04-09 MED ORDER — HEPARIN SODIUM (PORCINE) 5000 UNIT/ML IJ SOLN
INTRAMUSCULAR | Status: AC
  Filled 2017-04-09: qty 1

## 2017-04-09 MED ORDER — OXYCODONE-ACETAMINOPHEN 5-325 MG PO TABS: 1.0000 | ORAL_TABLET | ORAL | 0 refills | Status: AC | PRN

## 2017-04-09 MED ORDER — BUPIVACAINE-EPINEPHRINE (PF) 0.5% -1:200000 IJ SOLN
INTRAMUSCULAR | Status: DC | PRN
  Administered 2017-04-09: 30 mL via PERINEURAL

## 2017-04-09 MED ORDER — LIDOCAINE HCL (CARDIAC) 20 MG/ML IV SOLN
INTRAVENOUS | Status: AC
  Filled 2017-04-09: qty 5

## 2017-04-09 MED ORDER — DEXAMETHASONE SODIUM PHOSPHATE 10 MG/ML IJ SOLN
INTRAMUSCULAR | Status: AC
  Filled 2017-04-09: qty 1

## 2017-04-09 MED ORDER — PROPOFOL 500 MG/50ML IV EMUL
INTRAVENOUS | Status: DC | PRN
  Administered 2017-04-09: 75 ug/kg/min via INTRAVENOUS

## 2017-04-09 MED ORDER — CEFAZOLIN SODIUM-DEXTROSE 2-4 GM/100ML-% IV SOLN
2.0000 g | INTRAVENOUS | Status: AC
  Administered 2017-04-09: 2 g via INTRAVENOUS

## 2017-04-09 MED ORDER — CEFAZOLIN SODIUM-DEXTROSE 1-4 GM/50ML-% IV SOLN
INTRAVENOUS | Status: AC
  Filled 2017-04-09: qty 50

## 2017-04-09 MED ORDER — MEPERIDINE HCL 25 MG/ML IJ SOLN: 6.2500 mg | INTRAMUSCULAR | Status: DC | PRN

## 2017-04-09 MED ORDER — MIDAZOLAM HCL 2 MG/2ML IJ SOLN
INTRAMUSCULAR | Status: AC
Start: 2017-04-09 — End: 2017-04-09
  Filled 2017-04-09: qty 2

## 2017-04-09 MED ORDER — SUCCINYLCHOLINE CHLORIDE 200 MG/10ML IV SOSY
PREFILLED_SYRINGE | INTRAVENOUS | Status: AC
  Filled 2017-04-09: qty 10

## 2017-04-09 MED ORDER — EPHEDRINE 5 MG/ML INJ
INTRAVENOUS | Status: AC
  Filled 2017-04-09: qty 10

## 2017-04-09 MED ORDER — CEFAZOLIN SODIUM-DEXTROSE 2-4 GM/100ML-% IV SOLN
INTRAVENOUS | Status: AC
  Filled 2017-04-09: qty 100

## 2017-04-09 MED ORDER — OXYCODONE-ACETAMINOPHEN 5-325 MG PO TABS
2.0000 | ORAL_TABLET | Freq: Once | ORAL | Status: AC
  Administered 2017-04-09: 2 via ORAL
  Filled 2017-04-09: qty 2

## 2017-04-09 MED ORDER — LACTATED RINGERS IV SOLN
INTRAVENOUS | Status: DC
  Administered 2017-04-09 (×3): via INTRAVENOUS

## 2017-04-09 MED ORDER — SULFAMETHOXAZOLE-TRIMETHOPRIM 800-160 MG PO TABS: 1.0000 | ORAL_TABLET | Freq: Two times a day (BID) | ORAL | 0 refills | Status: DC

## 2017-04-09 MED ORDER — OXYCODONE-ACETAMINOPHEN 5-325 MG PO TABS: ORAL_TABLET | ORAL | 0 refills | Status: DC

## 2017-04-09 MED ORDER — CHLORHEXIDINE GLUCONATE 4 % EX LIQD: 60.0000 mL | Freq: Once | CUTANEOUS | Status: DC

## 2017-04-09 MED ORDER — SCOPOLAMINE 1 MG/3DAYS TD PT72: 1.0000 | MEDICATED_PATCH | Freq: Once | TRANSDERMAL | Status: DC | PRN

## 2017-04-09 MED ORDER — MIDAZOLAM HCL 2 MG/2ML IJ SOLN
1.0000 mg | INTRAMUSCULAR | Status: DC | PRN
  Administered 2017-04-09: 2 mg via INTRAVENOUS

## 2017-04-09 SURGICAL SUPPLY — 68 items
BAG DECANTER FOR FLEXI CONT (MISCELLANEOUS) IMPLANT
BANDAGE ACE 3X5.8 VEL STRL LF (GAUZE/BANDAGES/DRESSINGS) ×3 IMPLANT
BLADE MINI RND TIP GREEN BEAV (BLADE) IMPLANT
BLADE SURG 15 STRL LF DISP TIS (BLADE) ×2 IMPLANT
BLADE SURG 15 STRL SS (BLADE) ×4
BNDG ESMARK 4X9 LF (GAUZE/BANDAGES/DRESSINGS) ×3 IMPLANT
BNDG GAUZE ELAST 4 BULKY (GAUZE/BANDAGES/DRESSINGS) ×3 IMPLANT
BRUSH SCRUB EZ PLAIN DRY (MISCELLANEOUS) ×3 IMPLANT
CATH ROBINSON RED A/P 10FR (CATHETERS) IMPLANT
CHLORAPREP W/TINT 26ML (MISCELLANEOUS) IMPLANT
CORD BIPOLAR FORCEPS 12FT (ELECTRODE) ×3 IMPLANT
COVER BACK TABLE 60X90IN (DRAPES) ×3 IMPLANT
COVER MAYO STAND STRL (DRAPES) ×3 IMPLANT
CUFF TOURNIQUET SINGLE 18IN (TOURNIQUET CUFF) ×3 IMPLANT
CUFF TOURNIQUET SINGLE 24IN (TOURNIQUET CUFF) ×3 IMPLANT
DECANTER SPIKE VIAL GLASS SM (MISCELLANEOUS) ×3 IMPLANT
DRAPE EXTREMITY T 121X128X90 (DRAPE) ×3 IMPLANT
DRAPE SURG 17X23 STRL (DRAPES) ×3 IMPLANT
GAUZE SPONGE 4X4 12PLY STRL (GAUZE/BANDAGES/DRESSINGS) ×3 IMPLANT
GAUZE XEROFORM 1X8 LF (GAUZE/BANDAGES/DRESSINGS) ×3 IMPLANT
GAUZE XEROFORM 5X9 LF (GAUZE/BANDAGES/DRESSINGS) ×3 IMPLANT
GLOVE BIO SURGEON STRL SZ7.5 (GLOVE) ×3 IMPLANT
GLOVE BIOGEL PI IND STRL 7.0 (GLOVE) ×2 IMPLANT
GLOVE BIOGEL PI IND STRL 8 (GLOVE) ×1 IMPLANT
GLOVE BIOGEL PI IND STRL 8.5 (GLOVE) ×1 IMPLANT
GLOVE BIOGEL PI INDICATOR 7.0 (GLOVE) ×4
GLOVE BIOGEL PI INDICATOR 8 (GLOVE) ×2
GLOVE BIOGEL PI INDICATOR 8.5 (GLOVE) ×2
GLOVE ECLIPSE 7.0 STRL STRAW (GLOVE) ×3 IMPLANT
GLOVE SURG ORTHO 8.0 STRL STRW (GLOVE) IMPLANT
GOWN STRL REUS W/ TWL LRG LVL3 (GOWN DISPOSABLE) ×1 IMPLANT
GOWN STRL REUS W/TWL LRG LVL3 (GOWN DISPOSABLE) ×2
GOWN STRL REUS W/TWL XL LVL3 (GOWN DISPOSABLE) ×3 IMPLANT
LOOP VESSEL MAXI BLUE (MISCELLANEOUS) IMPLANT
NDL SAFETY ECLIPSE 18X1.5 (NEEDLE) IMPLANT
NEEDLE HYPO 18GX1.5 SHARP (NEEDLE)
NEEDLE HYPO 25X1 1.5 SAFETY (NEEDLE) IMPLANT
NS IRRIG 1000ML POUR BTL (IV SOLUTION) ×3 IMPLANT
PACK BASIN DAY SURGERY FS (CUSTOM PROCEDURE TRAY) ×3 IMPLANT
PAD CAST 3X4 CTTN HI CHSV (CAST SUPPLIES) ×1 IMPLANT
PAD CAST 4YDX4 CTTN HI CHSV (CAST SUPPLIES) ×1 IMPLANT
PADDING CAST ABS 4INX4YD NS (CAST SUPPLIES) ×2
PADDING CAST ABS COTTON 4X4 ST (CAST SUPPLIES) ×1 IMPLANT
PADDING CAST COTTON 3X4 STRL (CAST SUPPLIES) ×2
PADDING CAST COTTON 4X4 STRL (CAST SUPPLIES) ×2
SLEEVE SCD COMPRESS KNEE MED (MISCELLANEOUS) ×3 IMPLANT
SLING ARM FOAM STRAP XLG (SOFTGOODS) ×3 IMPLANT
SPEAR EYE SURG WECK-CEL (MISCELLANEOUS) ×3 IMPLANT
SPLINT PLASTER CAST XFAST 3X15 (CAST SUPPLIES) IMPLANT
SPLINT PLASTER XTRA FASTSET 3X (CAST SUPPLIES)
STOCKINETTE 4X48 STRL (DRAPES) ×3 IMPLANT
SUT ETHIBOND 3-0 V-5 (SUTURE) IMPLANT
SUT ETHILON 4 0 PS 2 18 (SUTURE) ×3 IMPLANT
SUT FIBERWIRE 4-0 18 TAPR NDL (SUTURE)
SUT MNCRL AB 4-0 PS2 18 (SUTURE) ×3 IMPLANT
SUT NYLON 9 0 VRM6 (SUTURE) ×3 IMPLANT
SUT POLY BUTTON 15MM (SUTURE) ×3 IMPLANT
SUT PROLENE 2 0 CT2 30 (SUTURE) ×3 IMPLANT
SUT PROLENE 6 0 P 1 18 (SUTURE) ×3 IMPLANT
SUT SILK 4 0 PS 2 (SUTURE) IMPLANT
SUT SUPRAMID 4-0 (SUTURE) IMPLANT
SUT VICRYL 4-0 PS2 18IN ABS (SUTURE) IMPLANT
SUTURE FIBERWR 4-0 18 TAPR NDL (SUTURE) IMPLANT
SYR BULB 3OZ (MISCELLANEOUS) ×3 IMPLANT
SYR CONTROL 10ML LL (SYRINGE) IMPLANT
TOWEL OR 17X24 6PK STRL BLUE (TOWEL DISPOSABLE) ×6 IMPLANT
TRAY DSU PREP LF (CUSTOM PROCEDURE TRAY) ×6 IMPLANT
UNDERPAD 30X30 (UNDERPADS AND DIAPERS) ×3 IMPLANT

## 2017-04-09 NOTE — H&P (Signed)
Daniel Strickland is an 32 y.o. male.   Chief Complaint: right hand crush HPI: 32 yo lhd male states he got right hand in cardboard machine yesterday.  Injury to long ring and small fingers.  Seen in ED where wounds cleaned and sutured.  Followed up in office.  He wishes to proceed with exploration of wounds with repair tendon/artery/nerve as necessary possible skin graft as necessary.  Allergies: No Known Allergies  Past Medical History:  Diagnosis Date  . Hypertension     Past Surgical History:  Procedure Laterality Date  . Circumsion     at age 32    Family History: Family History  Problem Relation Age of Onset  . Heart attack Maternal Grandfather     Social History:   reports that he has been smoking cigarettes.  He has been smoking about 0.50 packs per day. He does not have any smokeless tobacco history on file. He reports that he drinks alcohol. He reports that he does not use drugs.  Medications: Medications Prior to Admission  Medication Sig Dispense Refill  . doxycycline (VIBRAMYCIN) 100 MG capsule Take 1 capsule (100 mg total) by mouth 2 (two) times daily for 7 days. 14 capsule 0  . oxyCODONE-acetaminophen (PERCOCET/ROXICET) 5-325 MG tablet Take 1 tablet by mouth every 4 (four) hours as needed for up to 1 day for moderate pain or severe pain. 6 tablet 0    No results found for this or any previous visit (from the past 48 hour(s)).  Dg Hand Complete Right  Result Date: 04/08/2017 CLINICAL DATA:  Crush injury to the right hand. Hand got caught in a machine at work. EXAM: RIGHT HAND - COMPLETE 3+ VIEW COMPARISON:  None. FINDINGS: Lateral view significantly limited due to positioning and osseous overlap. A dressing overlies the fourth and fifth digits. Skin irregularity and minimal air in the soft tissues about the ulnar 4 hours and mid fifth digit. No radiopaque foreign body. No evidence of acute fracture. Soft tissue edema overlies the dorsal metacarpals. IMPRESSION: Soft  tissue injury to the fourth and fifth digits with overlying dressing in place. No radiopaque foreign body or acute fracture. Electronically Signed   By: Rubye OaksMelanie  Ehinger M.D.   On: 04/08/2017 23:53     A comprehensive review of systems was negative.  Blood pressure (!) 136/118, temperature 98.1 F (36.7 C), temperature source Oral, resp. rate (!) 21, height 6' (1.829 m), weight 115.3 kg (254 lb 4 oz), SpO2 100 %.  General appearance: alert, cooperative and appears stated age Head: Normocephalic, without obvious abnormality, atraumatic Neck: supple, symmetrical, trachea midline Cardio: regular rate and rhythm Resp: clear to auscultation bilaterally Extremities: Intact sensation and capillary refill all digits.  +epl/fpl/io.  Laceration at ulnar side right small finger.  Burns and skin loss dorsally. Pulses: 2+ and symmetric Skin: Skin color, texture, turgor normal. No rashes or lesions Neurologic: Grossly normal Incision/Wound: as above  Assessment/Plan Right hand crush injury and burn with lacerations.  Recommend exploration in OR with repair tendon/artery/nerve as necessary with possible full thickness skin graft as necessary.  Risks, benefits, and alternatives of surgery were discussed and the patient agrees with the plan of care.   Zeno Hickel R 04/09/2017, 12:49 PM

## 2017-04-09 NOTE — Transfer of Care (Signed)
Immediate Anesthesia Transfer of Care Note  Patient: Daniel Strickland  Procedure(s) Performed: NERVE, TENDON AND ARTERY REPAIR Exploration and repair as necessary Right long, ring and small finger (Right Hand)  Patient Location: PACU  Anesthesia Type:MAC combined with regional for post-op pain  Level of Consciousness: awake, alert , oriented and patient cooperative  Airway & Oxygen Therapy: Patient Spontanous Breathing and Patient connected to face mask oxygen  Post-op Assessment: Report given to RN and Post -op Vital signs reviewed and stable  Post vital signs: Reviewed and stable  Last Vitals:  Vitals Value Taken Time  BP    Temp    Pulse 68 04/09/2017  3:12 PM  Resp 10 04/09/2017  3:12 PM  SpO2 100 % 04/09/2017  3:12 PM  Vitals shown include unvalidated device data.  Last Pain:  Vitals:   04/09/17 1242  TempSrc: Oral  PainSc:       Patients Stated Pain Goal: 2 (03/70/48 8891)  Complications: No apparent anesthesia complications

## 2017-04-09 NOTE — Anesthesia Procedure Notes (Signed)
Anesthesia Regional Block: Supraclavicular block   Pre-Anesthetic Checklist: ,, timeout performed, Correct Patient, Correct Site, Correct Laterality, Correct Procedure, Correct Position, site marked, Risks and benefits discussed,  Surgical consent,  Pre-op evaluation,  At surgeon's request and post-op pain management  Laterality: Right  Prep: chloraprep       Needles:  Injection technique: Single-shot  Needle Type: Echogenic Stimulator Needle     Needle Length: 10cm  Needle Gauge: 21     Additional Needles:   Procedures:, nerve stimulator,,, ultrasound used (permanent image in chart),,,,   Nerve Stimulator or Paresthesia:  Response: 0.4 mA,   Additional Responses:   Narrative:  Start time: 04/09/2017 1:00 PM End time: 04/09/2017 1:10 PM Injection made incrementally with aspirations every 5 mL.  Performed by: Personally  Anesthesiologist: Arta Brucessey, Haleem Hanner, MD  Additional Notes: Monitors applied. Patient sedated. Sterile prep and drape,hand hygiene and sterile gloves were used. Relevant anatomy identified.Needle position confirmed.Local anesthetic injected incrementally after negative aspiration. Local anesthetic spread visualized around nerve(s). Vascular puncture avoided. No complications. Image printed for medical record.The patient tolerated the procedure well.

## 2017-04-09 NOTE — Brief Op Note (Signed)
04/09/2017  3:06 PM  PATIENT:  Rashod D Macneal  32 y.o. male  PRE-OPERATIVE DIAGNOSIS:  LACERATION RIGHT LONG, RING AND SMALL FINGER  POST-OPERATIVE DIAGNOSIS:  LACERATION RIGHT LONG, RING AND SMALL FINGER  PROCEDURE:  Procedure(s): NERVE, TENDON AND ARTERY REPAIR Exploration and repair as necessary Right long, ring and small finger (Right)  SURGEON:  Surgeon(s) and Role:    * Betha LoaKuzma, Marshae Azam, MD - Primary  PHYSICIAN ASSISTANT:   ASSISTANTS: none   ANESTHESIA:   regional and IV sedation  EBL:  Minimal   BLOOD ADMINISTERED:none  DRAINS: none   LOCAL MEDICATIONS USED:  NONE  SPECIMEN:  No Specimen  DISPOSITION OF SPECIMEN:  N/A  COUNTS:  YES  TOURNIQUET:   Total Tourniquet Time Documented: Upper Arm (Left) - 85 minutes Total: Upper Arm (Left) - 85 minutes   DICTATION: .Other Dictation: Dictation Number [id number]  PLAN OF CARE: Discharge to home after PACU  PATIENT DISPOSITION:  PACU - hemodynamically stable.

## 2017-04-09 NOTE — Discharge Instructions (Signed)
-  Take your antibiotics as prescribed -Take your pain medicines as prescribed (I prescribed 1 day's worth, and Dr. Merlyn LotKuzma can prescribe more on follow up)

## 2017-04-09 NOTE — Progress Notes (Signed)
Assisted Dr. Ossey with right, ultrasound guided, supraclavicular block. Side rails up, monitors on throughout procedure. See vital signs in flow sheet. Tolerated Procedure well. 

## 2017-04-09 NOTE — ED Notes (Signed)
Resident at bedside suturing. 

## 2017-04-09 NOTE — Anesthesia Postprocedure Evaluation (Signed)
Anesthesia Post Note  Patient: Daniel Strickland  Procedure(s) Performed: NERVE, TENDON AND ARTERY REPAIR Exploration and repair as necessary Right long, ring and small finger (Right Hand)     Patient location during evaluation: PACU Anesthesia Type: Regional Level of consciousness: awake and alert and patient cooperative Pain management: pain level controlled Vital Signs Assessment: post-procedure vital signs reviewed and stable Respiratory status: spontaneous breathing and respiratory function stable Cardiovascular status: stable Anesthetic complications: no    Last Vitals:  Vitals:   04/09/17 1530 04/09/17 1545  BP: 126/69 (!) 120/92  Pulse: 79 72  Resp: 12 17  Temp:    SpO2: 98% 98%    Last Pain:  Vitals:   04/09/17 1545  TempSrc:   PainSc: 0-No pain                 Haunani Dickard DAVID

## 2017-04-09 NOTE — Discharge Instructions (Addendum)

## 2017-04-09 NOTE — Anesthesia Preprocedure Evaluation (Signed)
Anesthesia Evaluation  Patient identified by MRN, date of birth, ID band Patient awake    Reviewed: Allergy & Precautions, NPO status , Patient's Chart, lab work & pertinent test results  Airway Mallampati: I  TM Distance: >3 FB Neck ROM: Full    Dental   Pulmonary Current Smoker,    Pulmonary exam normal        Cardiovascular hypertension, Pt. on medications Normal cardiovascular exam     Neuro/Psych    GI/Hepatic   Endo/Other    Renal/GU      Musculoskeletal   Abdominal   Peds  Hematology   Anesthesia Other Findings   Reproductive/Obstetrics                             Anesthesia Physical Anesthesia Plan  ASA: II and emergent  Anesthesia Plan: Regional and MAC   Post-op Pain Management:    Induction: Intravenous  PONV Risk Score and Plan: 0 and Treatment may vary due to age or medical condition  Airway Management Planned: Simple Face Mask  Additional Equipment:   Intra-op Plan:   Post-operative Plan:   Informed Consent: I have reviewed the patients History and Physical, chart, labs and discussed the procedure including the risks, benefits and alternatives for the proposed anesthesia with the patient or authorized representative who has indicated his/her understanding and acceptance.     Plan Discussed with: CRNA and Surgeon  Anesthesia Plan Comments:         Anesthesia Quick Evaluation
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
## 2017-04-12 ENCOUNTER — Encounter (HOSPITAL_BASED_OUTPATIENT_CLINIC_OR_DEPARTMENT_OTHER): Payer: Self-pay | Admitting: Orthopedic Surgery

## 2017-04-19 ENCOUNTER — Other Ambulatory Visit: Payer: Self-pay | Admitting: Orthopedic Surgery

## 2017-04-19 ENCOUNTER — Other Ambulatory Visit: Payer: Self-pay

## 2017-04-19 ENCOUNTER — Encounter (HOSPITAL_BASED_OUTPATIENT_CLINIC_OR_DEPARTMENT_OTHER): Payer: Self-pay | Admitting: *Deleted

## 2017-04-19 NOTE — Progress Notes (Signed)
   04/19/17 1040  OBSTRUCTIVE SLEEP APNEA  Have you ever been diagnosed with sleep apnea through a sleep study? No  Do you snore loudly (loud enough to be heard through closed doors)?  1  Do you often feel tired, fatigued, or sleepy during the daytime (such as falling asleep during driving or talking to someone)? 0  Has anyone observed you stop breathing during your sleep? 1  Do you have, or are you being treated for high blood pressure? 1  BMI more than 35 kg/m2? 0  Age > 50 (1-yes) 0  Male Gender (Yes=1) 1  Obstructive Sleep Apnea Score 4  Score 5 or greater  Results sent to PCP Daniel Strickland([facility])

## 2017-04-19 NOTE — Pre-Procedure Instructions (Signed)
To come for EKG 

## 2017-04-22 ENCOUNTER — Ambulatory Visit (HOSPITAL_BASED_OUTPATIENT_CLINIC_OR_DEPARTMENT_OTHER): Admitting: Anesthesiology

## 2017-04-22 ENCOUNTER — Other Ambulatory Visit: Payer: Self-pay

## 2017-04-22 ENCOUNTER — Encounter (HOSPITAL_BASED_OUTPATIENT_CLINIC_OR_DEPARTMENT_OTHER): Payer: Self-pay | Admitting: *Deleted

## 2017-04-22 ENCOUNTER — Ambulatory Visit (HOSPITAL_BASED_OUTPATIENT_CLINIC_OR_DEPARTMENT_OTHER)
Admission: RE | Admit: 2017-04-22 | Discharge: 2017-04-22 | Disposition: A | Discharge status: Home / Self Care | Payer: Self-pay | Source: Ambulatory Visit | Attending: Orthopedic Surgery | Admitting: Orthopedic Surgery

## 2017-04-22 ENCOUNTER — Encounter (HOSPITAL_BASED_OUTPATIENT_CLINIC_OR_DEPARTMENT_OTHER)
Admission: RE | Disposition: A | Discharge status: Home / Self Care | Payer: Self-pay | Source: Ambulatory Visit | Attending: Orthopedic Surgery

## 2017-04-22 DIAGNOSIS — E669 Obesity, unspecified: Secondary | ICD-10-CM | POA: Insufficient documentation

## 2017-04-22 DIAGNOSIS — F17210 Nicotine dependence, cigarettes, uncomplicated: Secondary | ICD-10-CM | POA: Insufficient documentation

## 2017-04-22 DIAGNOSIS — Z6834 Body mass index (BMI) 34.0-34.9, adult: Secondary | ICD-10-CM | POA: Insufficient documentation

## 2017-04-22 DIAGNOSIS — I10 Essential (primary) hypertension: Secondary | ICD-10-CM | POA: Insufficient documentation

## 2017-04-22 DIAGNOSIS — T23331A Burn of third degree of multiple right fingers (nail), not including thumb, initial encounter: Secondary | ICD-10-CM | POA: Insufficient documentation

## 2017-04-22 DIAGNOSIS — Z8249 Family history of ischemic heart disease and other diseases of the circulatory system: Secondary | ICD-10-CM | POA: Insufficient documentation

## 2017-04-22 DIAGNOSIS — Y939 Activity, unspecified: Secondary | ICD-10-CM | POA: Insufficient documentation

## 2017-04-22 DIAGNOSIS — X088XXA Exposure to other specified smoke, fire and flames, initial encounter: Secondary | ICD-10-CM | POA: Insufficient documentation

## 2017-04-22 DIAGNOSIS — T310 Burns involving less than 10% of body surface: Secondary | ICD-10-CM | POA: Insufficient documentation

## 2017-04-22 HISTORY — DX: Burn of unspecified degree of unspecified multiple fingers (nail), not including thumb, initial encounter: T23.039A

## 2017-04-22 HISTORY — PX: CYST EXCISION: SHX5701

## 2017-04-22 LAB — POCT I-STAT, CHEM 8
BUN: 11 mg/dL (ref 6–20)
CALCIUM ION: 1.19 mmol/L (ref 1.15–1.40)
CREATININE: 1 mg/dL (ref 0.61–1.24)
Chloride: 107 mmol/L (ref 101–111)
GLUCOSE: 96 mg/dL (ref 65–99)
HCT: 46 % (ref 39.0–52.0)
HEMOGLOBIN: 15.6 g/dL (ref 13.0–17.0)
POTASSIUM: 4.1 mmol/L (ref 3.5–5.1)
Sodium: 140 mmol/L (ref 135–145)
TCO2: 23 mmol/L (ref 22–32)

## 2017-04-22 SURGERY — CYST REMOVAL
Anesthesia: General | Site: Hand | Laterality: Right

## 2017-04-22 MED ORDER — OXYCODONE-ACETAMINOPHEN 5-325 MG PO TABS: ORAL_TABLET | ORAL | 0 refills | Status: DC

## 2017-04-22 MED ORDER — PROPOFOL 10 MG/ML IV BOLUS
INTRAVENOUS | Status: AC
  Filled 2017-04-22: qty 20

## 2017-04-22 MED ORDER — ONDANSETRON HCL 4 MG/2ML IJ SOLN
INTRAMUSCULAR | Status: AC
  Filled 2017-04-22: qty 2

## 2017-04-22 MED ORDER — CHLORHEXIDINE GLUCONATE 4 % EX LIQD: 60.0000 mL | Freq: Once | CUTANEOUS | Status: DC

## 2017-04-22 MED ORDER — DEXAMETHASONE SODIUM PHOSPHATE 10 MG/ML IJ SOLN
INTRAMUSCULAR | Status: AC
Start: 2017-04-22 — End: 2017-04-22
  Filled 2017-04-22: qty 1

## 2017-04-22 MED ORDER — PROPOFOL 10 MG/ML IV BOLUS
INTRAVENOUS | Status: DC | PRN
  Administered 2017-04-22: 200 mg via INTRAVENOUS

## 2017-04-22 MED ORDER — OXYCODONE HCL 5 MG PO TABS
10.0000 mg | ORAL_TABLET | Freq: Once | ORAL | Status: AC
  Administered 2017-04-22: 10 mg via ORAL

## 2017-04-22 MED ORDER — FENTANYL CITRATE (PF) 100 MCG/2ML IJ SOLN
INTRAMUSCULAR | Status: AC
  Filled 2017-04-22: qty 2

## 2017-04-22 MED ORDER — PROMETHAZINE HCL 25 MG/ML IJ SOLN: 6.2500 mg | INTRAMUSCULAR | Status: DC | PRN

## 2017-04-22 MED ORDER — CEFAZOLIN SODIUM-DEXTROSE 2-4 GM/100ML-% IV SOLN
2.0000 g | INTRAVENOUS | Status: AC
  Administered 2017-04-22: 2 g via INTRAVENOUS

## 2017-04-22 MED ORDER — FENTANYL CITRATE (PF) 100 MCG/2ML IJ SOLN
25.0000 ug | INTRAMUSCULAR | Status: DC | PRN
  Administered 2017-04-22 (×2): 50 ug via INTRAVENOUS

## 2017-04-22 MED ORDER — ACETAMINOPHEN 500 MG PO TABS
ORAL_TABLET | ORAL | Status: AC
  Filled 2017-04-22: qty 2

## 2017-04-22 MED ORDER — ONDANSETRON HCL 4 MG/2ML IJ SOLN
INTRAMUSCULAR | Status: DC | PRN
  Administered 2017-04-22: 4 mg via INTRAVENOUS

## 2017-04-22 MED ORDER — SCOPOLAMINE 1 MG/3DAYS TD PT72: 1.0000 | MEDICATED_PATCH | Freq: Once | TRANSDERMAL | Status: DC | PRN

## 2017-04-22 MED ORDER — LIDOCAINE HCL (CARDIAC) 20 MG/ML IV SOLN
INTRAVENOUS | Status: DC | PRN
  Administered 2017-04-22: 100 mg via INTRAVENOUS

## 2017-04-22 MED ORDER — CEFAZOLIN SODIUM-DEXTROSE 2-4 GM/100ML-% IV SOLN
INTRAVENOUS | Status: AC
  Filled 2017-04-22: qty 100

## 2017-04-22 MED ORDER — MIDAZOLAM HCL 2 MG/2ML IJ SOLN
INTRAMUSCULAR | Status: AC
  Filled 2017-04-22: qty 2

## 2017-04-22 MED ORDER — DEXAMETHASONE SODIUM PHOSPHATE 10 MG/ML IJ SOLN
INTRAMUSCULAR | Status: DC | PRN
  Administered 2017-04-22: 10 mg via INTRAVENOUS

## 2017-04-22 MED ORDER — FENTANYL CITRATE (PF) 100 MCG/2ML IJ SOLN
50.0000 ug | INTRAMUSCULAR | Status: AC | PRN
  Administered 2017-04-22: 50 ug via INTRAVENOUS
  Administered 2017-04-22: 100 ug via INTRAVENOUS
  Administered 2017-04-22: 25 ug via INTRAVENOUS
  Administered 2017-04-22: 50 ug via INTRAVENOUS
  Administered 2017-04-22: 25 ug via INTRAVENOUS
  Administered 2017-04-22: 50 ug via INTRAVENOUS

## 2017-04-22 MED ORDER — OXYCODONE HCL 5 MG PO TABS
ORAL_TABLET | ORAL | Status: AC
Start: 2017-04-22 — End: 2017-04-22
  Filled 2017-04-22: qty 2

## 2017-04-22 MED ORDER — ACETAMINOPHEN 500 MG PO TABS
1000.0000 mg | ORAL_TABLET | Freq: Once | ORAL | Status: AC
  Administered 2017-04-22: 1000 mg via ORAL

## 2017-04-22 MED ORDER — MIDAZOLAM HCL 2 MG/2ML IJ SOLN
1.0000 mg | INTRAMUSCULAR | Status: DC | PRN
  Administered 2017-04-22: 2 mg via INTRAVENOUS

## 2017-04-22 MED ORDER — LACTATED RINGERS IV SOLN
INTRAVENOUS | Status: DC
  Administered 2017-04-22 (×2): via INTRAVENOUS

## 2017-04-22 SURGICAL SUPPLY — 57 items
BANDAGE ACE 3X5.8 VEL STRL LF (GAUZE/BANDAGES/DRESSINGS) IMPLANT
BANDAGE COBAN STERILE 2 (GAUZE/BANDAGES/DRESSINGS) IMPLANT
BENZOIN TINCTURE PRP APPL 2/3 (GAUZE/BANDAGES/DRESSINGS) IMPLANT
BLADE MINI RND TIP GREEN BEAV (BLADE) IMPLANT
BLADE SURG 15 STRL LF DISP TIS (BLADE) ×2 IMPLANT
BLADE SURG 15 STRL SS (BLADE) ×4
BNDG COHESIVE 1X5 TAN STRL LF (GAUZE/BANDAGES/DRESSINGS) IMPLANT
BNDG CONFORM 2 STRL LF (GAUZE/BANDAGES/DRESSINGS) IMPLANT
BNDG ELASTIC 2X5.8 VLCR STR LF (GAUZE/BANDAGES/DRESSINGS) IMPLANT
BNDG ESMARK 4X9 LF (GAUZE/BANDAGES/DRESSINGS) ×3 IMPLANT
BNDG GAUZE 1X2.1 STRL (MISCELLANEOUS) IMPLANT
BNDG GAUZE ELAST 4 BULKY (GAUZE/BANDAGES/DRESSINGS) IMPLANT
BNDG PLASTER X FAST 3X3 WHT LF (CAST SUPPLIES) IMPLANT
CHLORAPREP W/TINT 26ML (MISCELLANEOUS) IMPLANT
CLOSURE WOUND 1/2 X4 (GAUZE/BANDAGES/DRESSINGS) ×1
CORD BIPOLAR FORCEPS 12FT (ELECTRODE) ×3 IMPLANT
COVER BACK TABLE 60X90IN (DRAPES) ×3 IMPLANT
COVER MAYO STAND STRL (DRAPES) ×3 IMPLANT
CUFF TOURNIQUET SINGLE 18IN (TOURNIQUET CUFF) IMPLANT
CUFF TOURNIQUET SINGLE 24IN (TOURNIQUET CUFF) ×3 IMPLANT
DRAPE EXTREMITY T 121X128X90 (DRAPE) ×3 IMPLANT
DRAPE SURG 17X23 STRL (DRAPES) ×3 IMPLANT
GAUZE SPONGE 4X4 12PLY STRL (GAUZE/BANDAGES/DRESSINGS) ×3 IMPLANT
GAUZE XEROFORM 1X8 LF (GAUZE/BANDAGES/DRESSINGS) ×3 IMPLANT
GLOVE BIO SURGEON STRL SZ 6.5 (GLOVE) ×4 IMPLANT
GLOVE BIO SURGEON STRL SZ7.5 (GLOVE) ×3 IMPLANT
GLOVE BIO SURGEONS STRL SZ 6.5 (GLOVE) ×2
GLOVE BIOGEL PI IND STRL 7.0 (GLOVE) ×1 IMPLANT
GLOVE BIOGEL PI IND STRL 8 (GLOVE) ×1 IMPLANT
GLOVE BIOGEL PI IND STRL 8.5 (GLOVE) ×1 IMPLANT
GLOVE BIOGEL PI INDICATOR 7.0 (GLOVE) ×2
GLOVE BIOGEL PI INDICATOR 8 (GLOVE) ×2
GLOVE BIOGEL PI INDICATOR 8.5 (GLOVE) ×2
GOWN SPEC L4 XLG W/TWL (GOWN DISPOSABLE) ×6 IMPLANT
GOWN STRL REUS W/ TWL LRG LVL3 (GOWN DISPOSABLE) ×1 IMPLANT
GOWN STRL REUS W/TWL LRG LVL3 (GOWN DISPOSABLE) ×2
MATRIX WOUND 3-LAYER 7X10 (Tissue) ×2 IMPLANT
MICROMATRIX 1000MG (Tissue) ×3 IMPLANT
NEEDLE HYPO 25X1 1.5 SAFETY (NEEDLE) ×3 IMPLANT
NS IRRIG 1000ML POUR BTL (IV SOLUTION) ×3 IMPLANT
PACK BASIN DAY SURGERY FS (CUSTOM PROCEDURE TRAY) ×3 IMPLANT
PAD CAST 3X4 CTTN HI CHSV (CAST SUPPLIES) IMPLANT
PAD CAST 4YDX4 CTTN HI CHSV (CAST SUPPLIES) IMPLANT
PADDING CAST ABS 4INX4YD NS (CAST SUPPLIES) ×2
PADDING CAST ABS COTTON 4X4 ST (CAST SUPPLIES) ×1 IMPLANT
PADDING CAST COTTON 3X4 STRL (CAST SUPPLIES)
PADDING CAST COTTON 4X4 STRL (CAST SUPPLIES)
SOLUTION PARTIC MCRMTRX 1000MG (Tissue) ×1 IMPLANT
STOCKINETTE 4X48 STRL (DRAPES) ×3 IMPLANT
STRIP CLOSURE SKIN 1/2X4 (GAUZE/BANDAGES/DRESSINGS) ×2 IMPLANT
SUT ETHILON 3 0 PS 1 (SUTURE) IMPLANT
SUT ETHILON 4 0 PS 2 18 (SUTURE) ×3 IMPLANT
SYR BULB 3OZ (MISCELLANEOUS) ×3 IMPLANT
SYR CONTROL 10ML LL (SYRINGE) ×3 IMPLANT
TOWEL OR 17X24 6PK STRL BLUE (TOWEL DISPOSABLE) ×6 IMPLANT
UNDERPAD 30X30 (UNDERPADS AND DIAPERS) ×3 IMPLANT
WOUND MATRIX 3-LAYER 7X10 (Tissue) ×1 IMPLANT

## 2017-04-22 NOTE — Discharge Instructions (Addendum)

## 2017-04-22 NOTE — Anesthesia Postprocedure Evaluation (Signed)
Anesthesia Post Note  Patient: Daniel Strickland  Procedure(s) Performed: RIGHT LONG [name]/RING SMALL FINGER DEBRIDEMENT BURED AND PLACEMENT INTEGRA (Right Hand)     Patient location during evaluation: PACU Anesthesia Type: General Level of consciousness: awake and alert Pain management: pain level controlled Vital Signs Assessment: post-procedure vital signs reviewed and stable Respiratory status: spontaneous breathing, nonlabored ventilation and respiratory function stable Cardiovascular status: blood pressure returned to baseline and stable Postop Assessment: no apparent nausea or vomiting Anesthetic complications: no    Last Vitals:  Vitals:   04/22/17 1500 04/22/17 1515  BP: (!) 155/102 (!) 152/110  Pulse: 89 90  Resp: 17 16  Temp:    SpO2: 100% 100%    Last Pain:  Vitals:   04/22/17 1514  TempSrc:   PainSc: 5                  Cecile HearingStephen Edward Emmalyn Hinson
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
## 2017-04-22 NOTE — Anesthesia Procedure Notes (Signed)
Procedure Name: LMA Insertion Date/Time: 04/22/2017 1:18 PM Performed by: Burna Cashonrad, Elliemae Braman C, CRNA Pre-anesthesia Checklist: Patient identified, Emergency Drugs available, Suction available and Patient being monitored Patient Re-evaluated:Patient Re-evaluated prior to induction Oxygen Delivery Method: Circle system utilized Preoxygenation: Pre-oxygenation with 100% oxygen Induction Type: IV induction Ventilation: Mask ventilation without difficulty LMA: LMA inserted LMA Size: 5.0 Number of attempts: 1 Airway Equipment and Method: Bite block Placement Confirmation: positive ETCO2 Tube secured with: Tape Dental Injury: Teeth and Oropharynx as per pre-operative assessment
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
## 2017-04-22 NOTE — Transfer of Care (Signed)
Immediate Anesthesia Transfer of Care Note  Patient: Daniel Strickland  Procedure(s) Performed: RIGHT LONG [name]/RING SMALL FINGER DEBRIDEMENT BURED AND PLACEMENT INTEGRA (Right Hand)  Patient Location: PACU  Anesthesia Type:General  Level of Consciousness: awake, alert  and oriented  Airway & Oxygen Therapy: Patient Spontanous Breathing and Patient connected to face mask oxygen  Post-op Assessment: Report given to RN and Post -op Vital signs reviewed and stable  Post vital signs: Reviewed and stable  Last Vitals:  Vitals Value Taken Time  BP 144/92 04/22/2017  2:26 PM  Temp    Pulse 90 04/22/2017  2:30 PM  Resp 16 04/22/2017  2:30 PM  SpO2 100 % 04/22/2017  2:30 PM  Vitals shown include unvalidated device data.  Last Pain:  Vitals:   04/22/17 1201  TempSrc: Oral  PainSc: 7       Patients Stated Pain Goal: 3 (04/22/17 1201)  Complications: No apparent anesthesia complications

## 2017-04-22 NOTE — Op Note (Signed)
[id number] 
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
## 2017-04-23 ENCOUNTER — Encounter (HOSPITAL_BASED_OUTPATIENT_CLINIC_OR_DEPARTMENT_OTHER): Payer: Self-pay | Admitting: Orthopedic Surgery

## 2017-07-11 ENCOUNTER — Encounter (HOSPITAL_COMMUNITY): Payer: Self-pay | Admitting: Emergency Medicine

## 2017-07-11 ENCOUNTER — Emergency Department (HOSPITAL_COMMUNITY)
Admission: EM | Admit: 2017-07-11 | Discharge: 2017-07-11 | Payer: Self-pay | Attending: Emergency Medicine | Admitting: Emergency Medicine

## 2017-07-11 DIAGNOSIS — Y92009 Unspecified place in unspecified non-institutional (private) residence as the place of occurrence of the external cause: Secondary | ICD-10-CM | POA: Insufficient documentation

## 2017-07-11 DIAGNOSIS — F17210 Nicotine dependence, cigarettes, uncomplicated: Secondary | ICD-10-CM | POA: Insufficient documentation

## 2017-07-11 DIAGNOSIS — S01312A Laceration without foreign body of left ear, initial encounter: Secondary | ICD-10-CM | POA: Insufficient documentation

## 2017-07-11 DIAGNOSIS — Y999 Unspecified external cause status: Secondary | ICD-10-CM | POA: Insufficient documentation

## 2017-07-11 DIAGNOSIS — S51012A Laceration without foreign body of left elbow, initial encounter: Secondary | ICD-10-CM | POA: Insufficient documentation

## 2017-07-11 DIAGNOSIS — S61412A Laceration without foreign body of left hand, initial encounter: Secondary | ICD-10-CM | POA: Insufficient documentation

## 2017-07-11 DIAGNOSIS — Y939 Activity, unspecified: Secondary | ICD-10-CM | POA: Insufficient documentation

## 2017-07-11 DIAGNOSIS — Z79899 Other long term (current) drug therapy: Secondary | ICD-10-CM | POA: Insufficient documentation

## 2017-07-11 DIAGNOSIS — I10 Essential (primary) hypertension: Secondary | ICD-10-CM | POA: Insufficient documentation

## 2017-07-11 MED ORDER — LIDOCAINE-EPINEPHRINE (PF) 2 %-1:200000 IJ SOLN
20.0000 mL | Freq: Once | INTRAMUSCULAR | Status: AC
  Administered 2017-07-11: 20 mL via INTRADERMAL
  Filled 2017-07-11: qty 20

## 2017-07-11 NOTE — Discharge Instructions (Addendum)
Keep area clean by washing with soap and water daily. Do not scrub your ear - there is glue there that will fall off on its own Apply a bandage at least once daily, change more often if it is dirty Watch for signs of infection (redness, drainage, worsening pain) Have stitches removed in 7-10 days. There are 4 in your hand and 1 in your elbow.
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
## 2017-07-11 NOTE — ED Notes (Signed)
[employer] officer returned to ED for copy of discharge papers. Verbalized understanding of care and follow up

## 2017-09-29 ENCOUNTER — Emergency Department (HOSPITAL_COMMUNITY)
Admission: EM | Admit: 2017-09-29 | Discharge: 2017-09-29 | Disposition: A | Discharge status: Home / Self Care | Payer: Self-pay | Attending: Emergency Medicine | Admitting: Emergency Medicine

## 2017-09-29 ENCOUNTER — Encounter (HOSPITAL_COMMUNITY): Payer: Self-pay | Admitting: Emergency Medicine

## 2017-09-29 ENCOUNTER — Emergency Department (HOSPITAL_COMMUNITY): Payer: Self-pay

## 2017-09-29 DIAGNOSIS — F17210 Nicotine dependence, cigarettes, uncomplicated: Secondary | ICD-10-CM | POA: Insufficient documentation

## 2017-09-29 DIAGNOSIS — J069 Acute upper respiratory infection, unspecified: Secondary | ICD-10-CM | POA: Insufficient documentation

## 2017-09-29 DIAGNOSIS — Z79899 Other long term (current) drug therapy: Secondary | ICD-10-CM | POA: Insufficient documentation

## 2017-09-29 DIAGNOSIS — I10 Essential (primary) hypertension: Secondary | ICD-10-CM | POA: Insufficient documentation

## 2017-09-29 DIAGNOSIS — B9789 Other viral agents as the cause of diseases classified elsewhere: Secondary | ICD-10-CM

## 2017-09-29 MED ORDER — CETIRIZINE HCL 5 MG PO TABS: 5.0000 mg | ORAL_TABLET | Freq: Every day | ORAL | 0 refills | Status: DC

## 2017-09-29 MED ORDER — NAPROXEN 500 MG PO TABS: 500.0000 mg | ORAL_TABLET | Freq: Two times a day (BID) | ORAL | 0 refills | Status: DC

## 2017-09-29 MED ORDER — BENZONATATE 100 MG PO CAPS: 200.0000 mg | ORAL_CAPSULE | Freq: Three times a day (TID) | ORAL | 0 refills | Status: DC

## 2017-09-29 MED ORDER — HYDROCHLOROTHIAZIDE 25 MG PO TABS
12.5000 mg | ORAL_TABLET | Freq: Once | ORAL | Status: AC
  Administered 2017-09-29: 12.5 mg via ORAL
  Filled 2017-09-29: qty 1

## 2017-09-29 MED ORDER — FLUTICASONE PROPIONATE 50 MCG/ACT NA SUSP: 1.0000 | Freq: Every day | NASAL | 2 refills | Status: DC

## 2017-09-29 MED ORDER — BENZONATATE 100 MG PO CAPS
200.0000 mg | ORAL_CAPSULE | Freq: Once | ORAL | Status: AC
Start: 2017-09-29 — End: 2017-09-29
  Administered 2017-09-29: 200 mg via ORAL
  Filled 2017-09-29: qty 2

## 2017-09-29 NOTE — ED Notes (Signed)
Discharge instructions/prescriptions reviewed with pt. Signature pad unavailable at time of pt discharge. Pt verbalized understanding and denied any further requests.

## 2017-09-29 NOTE — ED Provider Notes (Signed)
Patient placed in Quick Look pathway, seen and evaluated   Chief Complaint: cough  HPI:  Daniel Strickland is a 32 y.o. male who presents to ED for assessment of cough, congestion, yellow mucous, some blood tinged mucous from his nose and when he coughs, rib cage pain only with coughing.    ROS: Resp: cough  Physical Exam:  BP (!) 163/125 (BP Location: Right Arm)   Pulse 93   Temp 98.6 F (37 C) (Oral)   Resp 17   SpO2 100%    Gen: No distress  Neuro: Awake and Alert  Skin: Warm and dry    Initiation of care has begun. The patient has been counseled on the process, plan, and necessity for staying for the completion/evaluation, and the remainder of the medical screening examination    Janne Napoleon, NP 09/29/17 1405    Vanetta Mulders, MD 10/02/17 [phone]

## 2017-09-29 NOTE — ED Triage Notes (Signed)
Pt presents to ED for assessment of cough, congestion, yellow mucous, some blood tinged mucous from his nose and when he coughs, rib cage pain only with coughing.

## 2017-09-29 NOTE — Discharge Instructions (Signed)
Return to ED for worsening symptoms, trouble breathing or trouble swallowing, coughing or vomiting up blood, chest pain. You need to take your high blood pressure medicine as prescribed daily.
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
## 2018-10-17 ENCOUNTER — Other Ambulatory Visit: Payer: Self-pay | Admitting: *Deleted

## 2018-10-17 DIAGNOSIS — Z20822 Contact with and (suspected) exposure to covid-19: Secondary | ICD-10-CM

## 2018-10-18 LAB — NOVEL CORONAVIRUS, NAA: SARS-CoV-2, NAA: NOT DETECTED

## 2019-02-27 ENCOUNTER — Ambulatory Visit: Payer: HRSA Program | Attending: Internal Medicine

## 2019-02-27 DIAGNOSIS — Z20822 Contact with and (suspected) exposure to covid-19: Secondary | ICD-10-CM | POA: Diagnosis present

## 2019-02-28 LAB — NOVEL CORONAVIRUS, NAA: SARS-CoV-2, NAA: NOT DETECTED

## 2019-05-01 ENCOUNTER — Other Ambulatory Visit: Payer: Self-pay

## 2019-05-01 ENCOUNTER — Ambulatory Visit: Payer: Self-pay | Attending: Internal Medicine

## 2019-05-01 DIAGNOSIS — Z20822 Contact with and (suspected) exposure to covid-19: Secondary | ICD-10-CM

## 2019-05-02 LAB — SARS-COV-2, NAA 2 DAY TAT

## 2019-05-02 LAB — NOVEL CORONAVIRUS, NAA: SARS-CoV-2, NAA: NOT DETECTED

## 2019-08-14 ENCOUNTER — Other Ambulatory Visit: Payer: Self-pay

## 2019-08-14 ENCOUNTER — Emergency Department (HOSPITAL_COMMUNITY)
Admission: EM | Admit: 2019-08-14 | Discharge: 2019-08-14 | Disposition: A | Discharge status: Home / Self Care | Payer: BC Managed Care – PPO | Attending: Emergency Medicine | Admitting: Emergency Medicine

## 2019-08-14 ENCOUNTER — Emergency Department (HOSPITAL_COMMUNITY): Payer: BC Managed Care – PPO

## 2019-08-14 DIAGNOSIS — J069 Acute upper respiratory infection, unspecified: Secondary | ICD-10-CM | POA: Insufficient documentation

## 2019-08-14 DIAGNOSIS — R0602 Shortness of breath: Secondary | ICD-10-CM | POA: Diagnosis not present

## 2019-08-14 DIAGNOSIS — Z760 Encounter for issue of repeat prescription: Secondary | ICD-10-CM | POA: Diagnosis not present

## 2019-08-14 DIAGNOSIS — F1590 Other stimulant use, unspecified, uncomplicated: Secondary | ICD-10-CM | POA: Diagnosis not present

## 2019-08-14 DIAGNOSIS — Z79899 Other long term (current) drug therapy: Secondary | ICD-10-CM | POA: Diagnosis not present

## 2019-08-14 DIAGNOSIS — F17210 Nicotine dependence, cigarettes, uncomplicated: Secondary | ICD-10-CM | POA: Diagnosis not present

## 2019-08-14 DIAGNOSIS — R079 Chest pain, unspecified: Secondary | ICD-10-CM | POA: Diagnosis not present

## 2019-08-14 DIAGNOSIS — Z20822 Contact with and (suspected) exposure to covid-19: Secondary | ICD-10-CM | POA: Diagnosis not present

## 2019-08-14 DIAGNOSIS — I10 Essential (primary) hypertension: Secondary | ICD-10-CM | POA: Insufficient documentation

## 2019-08-14 LAB — BASIC METABOLIC PANEL
Anion gap: 8 (ref 5–15)
BUN: 7 mg/dL (ref 6–20)
CO2: 24 mmol/L (ref 22–32)
Calcium: 9.6 mg/dL (ref 8.9–10.3)
Chloride: 103 mmol/L (ref 98–111)
Creatinine, Ser: 1.09 mg/dL (ref 0.61–1.24)
GFR calc Af Amer: >60 mL/min (ref >60)
GFR calc non Af Amer: >60 mL/min (ref >60)
Glucose, Bld: 103 mg/dL — ABNORMAL HIGH (ref 70–99)
Potassium: 4.5 mmol/L (ref 3.5–5.1)
Sodium: 135 mmol/L (ref 135–145)

## 2019-08-14 LAB — CBC
HCT: 48.4 % (ref 39.0–52.0)
Hemoglobin: 16.2 g/dL (ref 13.0–17.0)
MCH: 30.3 pg (ref 26.0–34.0)
MCHC: 33.5 g/dL (ref 30.0–36.0)
MCV: 90.5 fL (ref 80.0–100.0)
Platelets: 197 10*3/uL (ref 150–400)
RBC: 5.35 MIL/uL (ref 4.22–5.81)
RDW: 11.9 % (ref 11.5–15.5)
WBC: 9.4 10*3/uL (ref 4.0–10.5)
nRBC: 0 % (ref 0.0–0.2)

## 2019-08-14 LAB — SARS CORONAVIRUS 2 (TAT 6-24 HRS): SARS Coronavirus 2: NEGATIVE

## 2019-08-14 LAB — TROPONIN I (HIGH SENSITIVITY): Troponin I (High Sensitivity): 4 ng/L (ref <18)

## 2019-08-14 MED ORDER — PREDNISONE 20 MG PO TABS
60.0000 mg | ORAL_TABLET | Freq: Once | ORAL | Status: AC
  Administered 2019-08-14: 60 mg via ORAL
  Filled 2019-08-14: qty 3

## 2019-08-14 MED ORDER — PREDNISONE 10 MG PO TABS: 40.0000 mg | ORAL_TABLET | Freq: Every day | ORAL | 0 refills | Status: AC

## 2019-08-14 MED ORDER — HYDROCODONE-ACETAMINOPHEN 5-325 MG PO TABS
1.0000 | ORAL_TABLET | Freq: Once | ORAL | Status: AC
  Administered 2019-08-14: 1 via ORAL
  Filled 2019-08-14: qty 1

## 2019-08-14 MED ORDER — SODIUM CHLORIDE 0.9% FLUSH: 3.0000 mL | Freq: Once | INTRAVENOUS | Status: DC

## 2019-08-14 MED ORDER — ALBUTEROL SULFATE HFA 108 (90 BASE) MCG/ACT IN AERS
3.0000 | INHALATION_SPRAY | Freq: Once | RESPIRATORY_TRACT | Status: AC
  Administered 2019-08-14: 3 via RESPIRATORY_TRACT
  Filled 2019-08-14: qty 6.7

## 2019-08-14 MED ORDER — AEROCHAMBER PLUS FLO-VU LARGE MISC
1.0000 | Freq: Once | Status: AC
  Administered 2019-08-14: 1

## 2019-08-14 MED ORDER — HYDROCHLOROTHIAZIDE 12.5 MG PO TABS: 12.5000 mg | ORAL_TABLET | Freq: Every day | ORAL | 0 refills | Status: DC

## 2019-08-14 MED ORDER — BENZONATATE 100 MG PO CAPS: 100.0000 mg | ORAL_CAPSULE | Freq: Three times a day (TID) | ORAL | 0 refills | Status: DC
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
## 2019-08-14 NOTE — ED Triage Notes (Signed)
Pt arrives pov with reports of shob and coughing X5 days. Pt tried otc meds without relief. Now endorsing chest pain.

## 2019-08-14 NOTE — Discharge Instructions (Signed)
We will contact you with the results of your Covid test when it is available. Follow-up with your primary care provider. Return to the ER for worsening cough, chest tightness, shortness of breath, leg swelling.

## 2019-09-24 DIAGNOSIS — Z20822 Contact with and (suspected) exposure to covid-19: Secondary | ICD-10-CM | POA: Diagnosis not present

## 2019-10-12 DIAGNOSIS — F17200 Nicotine dependence, unspecified, uncomplicated: Secondary | ICD-10-CM | POA: Diagnosis not present

## 2019-10-12 DIAGNOSIS — E669 Obesity, unspecified: Secondary | ICD-10-CM | POA: Diagnosis not present

## 2019-10-12 DIAGNOSIS — I10 Essential (primary) hypertension: Secondary | ICD-10-CM | POA: Diagnosis not present

## 2019-10-12 DIAGNOSIS — H60509 Unspecified acute noninfective otitis externa, unspecified ear: Secondary | ICD-10-CM | POA: Diagnosis not present

## 2019-10-13 DIAGNOSIS — Z136 Encounter for screening for cardiovascular disorders: Secondary | ICD-10-CM | POA: Diagnosis not present

## 2019-10-13 DIAGNOSIS — Z131 Encounter for screening for diabetes mellitus: Secondary | ICD-10-CM | POA: Diagnosis not present

## 2019-10-13 DIAGNOSIS — I10 Essential (primary) hypertension: Secondary | ICD-10-CM | POA: Diagnosis not present

## 2019-12-19 DIAGNOSIS — N5319 Other ejaculatory dysfunction: Secondary | ICD-10-CM | POA: Diagnosis not present

## 2019-12-19 DIAGNOSIS — R0789 Other chest pain: Secondary | ICD-10-CM | POA: Diagnosis not present

## 2019-12-19 DIAGNOSIS — F17200 Nicotine dependence, unspecified, uncomplicated: Secondary | ICD-10-CM | POA: Diagnosis not present

## 2019-12-19 DIAGNOSIS — I10 Essential (primary) hypertension: Secondary | ICD-10-CM | POA: Diagnosis not present

## 2019-12-26 ENCOUNTER — Other Ambulatory Visit (HOSPITAL_BASED_OUTPATIENT_CLINIC_OR_DEPARTMENT_OTHER): Payer: Self-pay

## 2019-12-26 DIAGNOSIS — G4733 Obstructive sleep apnea (adult) (pediatric): Secondary | ICD-10-CM

## 2019-12-26 DIAGNOSIS — R5383 Other fatigue: Secondary | ICD-10-CM

## 2019-12-26 DIAGNOSIS — R0683 Snoring: Secondary | ICD-10-CM

## 2019-12-26 DIAGNOSIS — G4710 Hypersomnia, unspecified: Secondary | ICD-10-CM

## 2020-01-09 ENCOUNTER — Ambulatory Visit (HOSPITAL_BASED_OUTPATIENT_CLINIC_OR_DEPARTMENT_OTHER): Payer: BC Managed Care – PPO | Attending: Internal Medicine | Admitting: Internal Medicine

## 2020-01-09 ENCOUNTER — Other Ambulatory Visit: Payer: Self-pay

## 2020-01-09 DIAGNOSIS — G4733 Obstructive sleep apnea (adult) (pediatric): Secondary | ICD-10-CM | POA: Insufficient documentation

## 2020-01-09 DIAGNOSIS — R0683 Snoring: Secondary | ICD-10-CM

## 2020-01-09 DIAGNOSIS — R5383 Other fatigue: Secondary | ICD-10-CM

## 2020-01-09 DIAGNOSIS — G4710 Hypersomnia, unspecified: Secondary | ICD-10-CM

## 2020-01-13 DIAGNOSIS — R0683 Snoring: Secondary | ICD-10-CM

## 2020-01-13 NOTE — Procedures (Signed)
   Patient Name: Daniel Strickland, Daniel Strickland Date: 01/09/2020 Gender: Male D.O.B: 1985-06-01 Age (years): 27 Referring Provider: Jamison Oka MD Height (inches): 72 Interpreting Physician: Jetty Duhamel MD, ABSM Weight (lbs): 282 RPSGT: Templeton Sink BMI: 38 MRN: [id number] Neck Size: 18.00 <br> <br> CLINICAL INFORMATION Sleep Study Type: HST Indication for sleep study: OSA Epworth Sleepiness Score: 11  SLEEP STUDY TECHNIQUE A multi-channel overnight portable sleep study was performed. The channels recorded were: nasal airflow, thoracic respiratory movement, and oxygen saturation with a pulse oximetry. Snoring was also monitored.  MEDICATIONS Patient self administered medications include: none reported.  SLEEP ARCHITECTURE Patient was studied for 444.4 minutes. The sleep efficiency was 100.0 % and the patient was supine for 63.2%. The arousal index was 0.0 per hour.  RESPIRATORY PARAMETERS The overall AHI was 37.9 per hour, with a central apnea index of 0.0 per hour. The oxygen nadir was 79% during sleep.  CARDIAC DATA Mean heart rate during sleep was 89.0 bpm.  IMPRESSIONS - Severe obstructive sleep apnea occurred during this study (AHI = 37.9/h). - No significant central sleep apnea occurred during this study (CAI = 0.0/h). - Oxygen desaturation was noted during this study (Min O2 = 79%). Mean O2 sat 92%. - Patient snored.  DIAGNOSIS - Obstructive Sleep Apnea (G47.33)  RECOMMENDATIONS - Suggest CPAP titration sleep study or autopap. Other options would be based on clinical judgment. - Be careful with alcohol, sedatives and other CNS depressants that may worsen sleep apnea and disrupt normal sleep architecture. - Sleep hygiene should be reviewed to assess factors that may improve sleep quality. - Weight management and regular exercise should be initiated or continued.  [Electronically signed] 01/13/2020 02:34 PM  Jetty Duhamel MD, ABSM Diplomate, American Board  of Sleep Medicine   NPI: [id number]                         Jetty Duhamel Diplomate, American Board of Sleep Medicine  ELECTRONICALLY SIGNED ON:  01/13/2020, 2:32 PM [facility] PH: [phone]   FX: [phone] ACCREDITED BY THE AMERICAN ACADEMY OF SLEEP MEDICINE

## 2020-01-18 DIAGNOSIS — U071 COVID-19: Secondary | ICD-10-CM | POA: Diagnosis not present

## 2020-01-18 DIAGNOSIS — R051 Acute cough: Secondary | ICD-10-CM | POA: Diagnosis not present

## 2020-01-23 DIAGNOSIS — U071 COVID-19: Secondary | ICD-10-CM | POA: Diagnosis not present

## 2020-01-23 DIAGNOSIS — R051 Acute cough: Secondary | ICD-10-CM | POA: Diagnosis not present

## 2020-01-30 DIAGNOSIS — Z20822 Contact with and (suspected) exposure to covid-19: Secondary | ICD-10-CM | POA: Diagnosis not present

## 2020-09-15 ENCOUNTER — Emergency Department (HOSPITAL_COMMUNITY): Payer: Self-pay

## 2020-09-15 ENCOUNTER — Encounter (HOSPITAL_COMMUNITY): Payer: Self-pay | Admitting: Emergency Medicine

## 2020-09-15 ENCOUNTER — Emergency Department (HOSPITAL_COMMUNITY)
Admission: EM | Admit: 2020-09-15 | Discharge: 2020-09-15 | Disposition: A | Discharge status: Home / Self Care | Payer: Self-pay | Attending: Emergency Medicine | Admitting: Emergency Medicine

## 2020-09-15 ENCOUNTER — Other Ambulatory Visit: Payer: Self-pay

## 2020-09-15 DIAGNOSIS — F1910 Other psychoactive substance abuse, uncomplicated: Secondary | ICD-10-CM

## 2020-09-15 DIAGNOSIS — Y900 Blood alcohol level of less than 20 mg/100 ml: Secondary | ICD-10-CM | POA: Insufficient documentation

## 2020-09-15 DIAGNOSIS — Z79899 Other long term (current) drug therapy: Secondary | ICD-10-CM | POA: Insufficient documentation

## 2020-09-15 DIAGNOSIS — R0789 Other chest pain: Secondary | ICD-10-CM | POA: Insufficient documentation

## 2020-09-15 DIAGNOSIS — I10 Essential (primary) hypertension: Secondary | ICD-10-CM | POA: Insufficient documentation

## 2020-09-15 DIAGNOSIS — F17210 Nicotine dependence, cigarettes, uncomplicated: Secondary | ICD-10-CM | POA: Insufficient documentation

## 2020-09-15 DIAGNOSIS — R000 Tachycardia, unspecified: Secondary | ICD-10-CM | POA: Insufficient documentation

## 2020-09-15 DIAGNOSIS — F321 Major depressive disorder, single episode, moderate: Secondary | ICD-10-CM

## 2020-09-15 DIAGNOSIS — Z20822 Contact with and (suspected) exposure to covid-19: Secondary | ICD-10-CM | POA: Insufficient documentation

## 2020-09-15 LAB — CBC
HCT: 46.8 % (ref 39.0–52.0)
Hemoglobin: 15.7 g/dL (ref 13.0–17.0)
MCH: 31 pg (ref 26.0–34.0)
MCHC: 33.5 g/dL (ref 30.0–36.0)
MCV: 92.3 fL (ref 80.0–100.0)
Platelets: 201 10*3/uL (ref 150–400)
RBC: 5.07 MIL/uL (ref 4.22–5.81)
RDW: 12.4 % (ref 11.5–15.5)
WBC: 11.4 10*3/uL — ABNORMAL HIGH (ref 4.0–10.5)
nRBC: 0 % (ref 0.0–0.2)

## 2020-09-15 LAB — BASIC METABOLIC PANEL
Anion gap: 9 (ref 5–15)
BUN: 14 mg/dL (ref 6–20)
CO2: 23 mmol/L (ref 22–32)
Calcium: 9.8 mg/dL (ref 8.9–10.3)
Chloride: 106 mmol/L (ref 98–111)
Creatinine, Ser: 1.03 mg/dL (ref 0.61–1.24)
GFR, Estimated: >60 mL/min (ref >60)
Glucose, Bld: 113 mg/dL — ABNORMAL HIGH (ref 70–99)
Potassium: 4.2 mmol/L (ref 3.5–5.1)
Sodium: 138 mmol/L (ref 135–145)

## 2020-09-15 LAB — ETHANOL: Alcohol, Ethyl (B): <10 mg/dL (ref <10)

## 2020-09-15 LAB — ACETAMINOPHEN LEVEL: Acetaminophen (Tylenol), Serum: <10 ug/mL — ABNORMAL LOW (ref 10–30)

## 2020-09-15 LAB — RESP PANEL BY RT-PCR (FLU A&B, COVID) ARPGX2
Influenza A by PCR: NEGATIVE
Influenza B by PCR: NEGATIVE
SARS Coronavirus 2 by RT PCR: NEGATIVE

## 2020-09-15 LAB — SALICYLATE LEVEL: Salicylate Lvl: <7 mg/dL — ABNORMAL LOW (ref 7.0–30.0)

## 2020-09-15 LAB — TROPONIN I (HIGH SENSITIVITY)
Troponin I (High Sensitivity): 6 ng/L (ref <18)
Troponin I (High Sensitivity): 6 ng/L (ref <18)

## 2020-09-15 NOTE — Discharge Instructions (Addendum)

## 2020-09-15 NOTE — ED Provider Notes (Signed)
Patient taken at shift handoff from PA McDonald.  Patient on alcohol and cocaine binge over the past several days plus multiple familial losses.  I was asked to follow-up on his lab values and TTS assessment.   9:43 AM Vitals:   09/15/20 0555 09/15/20 0658 09/15/20 0800 09/15/20 0845  BP: (!) 152/99 (!) 147/97 (!) 142/81 135/80  Pulse: (!) 102 (!) 111 (!) 104 100  Resp: (!) 22 15 15  (!) 21  Temp:      TempSrc:      SpO2: 95% 98% 98% 98%  Weight:      Height:       Patient's blood pressure has normalized.  I reviewed the patient's lab assessment.  He has 2 negative troponins, ethanol, Tylenol, salicylate level within normal limits, CBC shows mildly elevated white blood cell count likely acute phase reaction, BMP with mildly elevated glucose also likely acute phase reaction.  Respiratory panel is negative.  Patient earlier hypertension and tachycardia likely secondary to his cocaine use.  Patient's vital signs have normalized.  I reviewed the patient's 1 view chest x-ray which shows no acute abnormalities.  EKG shows sinus tachycardia at a rate of 103 without ischemic changes.  Patient seen and cleared by TTS.  Will discharge with outpatient resource guide for polysubstance abuse.  Patient appears otherwise appropriate for discharge at this time.   , PA-C 09/15/20 0945    09/17/20, DO 09/15/20 1127

## 2020-09-15 NOTE — ED Triage Notes (Addendum)
Patient arrives from home via EMS complaining of a productive cough. Cough began yesterday with green sputum. Discomfort associated with cough. No N/V/D or fever. Patient would also like to talk to TTS due to increased life stressors. No SI/HI
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
## 2020-09-15 NOTE — BH Assessment (Signed)
Comprehensive Clinical Assessment (CCA) Note  09/15/2020 Daniel Strickland [id number]  DISPOSITION:  Consulted with Daniel Skinner, NP, who determined that Pt does not meet inpatient criteria and may be discharged.  It is recommended that he follow-up with outpatient substance use resources.  The patient demonstrates the following risk factors for suicide: Chronic risk factors for suicide include: substance use disorder and previous suicide attempts 1 . Acute risk factors for suicide include:  Stressors, substance use . Protective factors for this patient include: positive social support and responsibility to others (children, family). Considering these factors, the overall suicide risk at this point appears to be low. Patient is appropriate for outpatient follow up.   Flowsheet Row ED from 09/15/2020 in [facility]ERGENCY DEPT  C-SSRS RISK CATEGORY No Risk       Chief Complaint:  Chief Complaint  Patient presents with   Chest Pain    Chest pain and recent binge use of alcohol, cocaine, and marijuana.  UDS was not available at time of assessment.   Visit Diagnosis: MDD (per history); Binge Use of Alcohol, THC, Cocaine  Narrative:  Pt is a 35 year old male who presented to [facility] on a voluntary basis with complaint of recent binge use of alcohol, marijuana, and cocaine.  Pt presented to the ED due to chest pains and ''black phlegm.''  Pt stated that on Friday, he began binge use of an unknown quantity of alcohol, cocaine, and marijuana and stayed awake for 50 hours.  He was not sure of the amount ingested, and his UDS was not available at time of assessment.  Pt's BAC was clear.  Pt said that he had been sober for several years before binge use.  Pt denied SI, HI, and AVH.  He stated that he wants treatment for chest pain only.  Pt has been treated for depression and suicidal ideation in the past.  Pt does not have a current provider.  Pt endorsed stressors at home (his partner's  three children are returning to school) and financial issues.  Pt lives with girlfriend and her three children.  He works as a Naval architect.  During assessment, Pt presented as alert and oriented.  He had good eye contact and was cooperative.   Pt was in scrubs, and he appeared appropriately groomed.  Pt's mood was ''stressed,'' and affect was preoccupied.  Pt's speech was normal in rate, rhythm, and volume.  Thought processes were within normal range, and thought content was logical and goal-oriented.  There was no evidence of delusion.  Memory and concentration were intact.  Insight was fair.  Judgment was poor as evidenced by binge use of substances. Impulse control was poor.  CCA Screening, Triage and Referral (STR)  Patient Reported Information How did you hear about [place]? Self  What Is the Reason for Your Visit/Call Today? Binge use of substances; chest pain  How Long Has This Been Causing You Problems? <Week  What Do You Feel Would Help You the Most Today? Alcohol or Drug Use Treatment; Stress Management   Have You Recently Had Any Thoughts About Hurting Yourself? No  Are You Planning to Commit Suicide/Harm Yourself At This time? No   Have you Recently Had Thoughts About Hurting Someone Daniel Strickland? No  Are You Planning to Harm Someone at This Time? No  Explanation: No data recorded  Have You Used Any Alcohol or Drugs in the Past 24 Hours? Yes  How Long Ago Did You Use Drugs or Alcohol? No  data recorded What Did You Use and How Much? Unknown quantity of alcohol, marijuana, and cocaine   Do You Currently Have a Therapist/Psychiatrist? No  Name of Therapist/Psychiatrist: No data recorded  Have You Been Recently Discharged From Any Office Practice or Programs? No  Explanation of Discharge From Practice/Program: No data recorded    CCA Screening Triage Referral Assessment Type of Contact: Tele-Assessment  Telemedicine Service Delivery: Telemedicine service delivery: This  service was provided via telemedicine using a 2-way, interactive audio and video technology  Is this Initial or Reassessment? Initial Assessment  Date Telepsych consult ordered in [facility]:  09/15/20  Time Telepsych consult ordered in [facility]:  No data recorded Location of Assessment: [facility] ED  [facility]   Collateral Involvement: NA   Does Patient Have a Court Appointed Legal Guardian? No data recorded Name and Contact of Legal Guardian: No data recorded If Minor and Not Living with Parent(s), Who has Custody? No data recorded Is CPS involved or ever been involved? Never  Is APS involved or ever been involved? Never   Patient Determined To Be At Risk for Harm To Self or Others Based on Review of Patient Reported Information or Presenting Complaint? No  Method: No data recorded Availability of Means: No data recorded Intent: No data recorded Notification Required: No data recorded Additional Information for Danger to Others Potential: No data recorded Additional Comments for Danger to Others Potential: No data recorded Are There Guns or Other Weapons in Your Home? No data recorded Types of Guns/Weapons: No data recorded Are These Weapons Safely Secured?                            No data recorded Who Could Verify You Are Able To Have These Secured: No data recorded Do You Have any Outstanding Charges, Pending Court Dates, Parole/Probation? No data recorded Contacted To Inform of Risk of Harm To Self or Others: No data recorded   Does Patient Present under Involuntary Commitment? No  IVC Papers Initial File Date: No data recorded  [place]   Patient Currently Receiving the Following Services: [facility] (Substance Abuse Intensive Outpatient Program   Determination of Need: Urgent (48 hours)   Options For Referral: Outpatient Therapy; Mobile Crisis     CCA Biopsychosocial Patient Reported Schizophrenia/Schizoaffective  Diagnosis in Past: No   Strengths: Some insight; employed.  Stated that he can stop using.   Mental Health Symptoms Depression:   Hopelessness; Sleep (too much or little)   Duration of Depressive symptoms:  Duration of Depressive Symptoms: Greater than two weeks   Mania:   None   Anxiety:    None   Psychosis:   None   Duration of Psychotic symptoms:    Trauma:   None   Obsessions:   None   Compulsions:   None   Inattention:   None   Hyperactivity/Impulsivity:   None   Oppositional/Defiant Behaviors:   None   Emotional Irregularity:   Potentially harmful impulsivity   Other Mood/Personality Symptoms:  No data recorded   Mental Status Exam Appearance and self-care  Stature:   Average   Weight:   Average weight   Clothing:   Casual   Grooming:   Normal   Cosmetic use:   None   Posture/gait:   Normal   Motor activity:   Not Remarkable   Sensorium  Attention:   Normal   Concentration:   Normal  Orientation:   X5   Recall/memory:   Normal   Affect and Mood  Affect:   Appropriate   Mood:   Dysphoric   Relating  Eye contact:   Normal   Facial expression:   Responsive   Attitude toward examiner:   Cooperative   Thought and Language  Speech flow:  Clear and Coherent   Thought content:   Appropriate to Mood and Circumstances   Preoccupation:   None   Hallucinations:   None   Organization:  No data recorded  [employer] of Knowledge:   Average   Intelligence:   Average   Abstraction:   Normal   Judgement:   Poor   Reality Testing:   Adequate   Insight:   Fair   Decision Making:   Impulsive   Social Functioning  Social Maturity:   Impulsive   Social Judgement:   Heedless   Stress  Stressors:   Family conflict; Financial   Coping Ability:   Overwhelmed   Skill Deficits:   None   Supports:   Support needed     Religion: Religion/Spirituality Are You A  Religious Person?: No  Leisure/Recreation:    Exercise/Diet: Exercise/Diet Do You Exercise?: No Have You Gained or Lost A Significant Amount of Weight in the Past Six Months?: No Do You Follow a Special Diet?: No Do You Have Any Trouble Sleeping?: Yes Explanation of Sleeping Difficulties: Pt reported that he was awake for 50 hours   CCA Employment/Education Employment/Work Situation: Employment / Work Situation Employment Situation: Employed Patient's Job has Been Impacted by Current Illness: No Has Patient ever Been in Equities trader?: No  Education: Education Is Patient Currently Attending School?: No   CCA Family/Childhood History Family and Relationship History: Family history Marital status: Long term relationship What types of issues is patient dealing with in the relationship?: Stress with partner's three children Does patient have children?: No  Childhood History:  Childhood History By whom was/is the patient raised?: Both parents Did patient suffer any verbal/emotional/physical/sexual abuse as a child?: No  Child/Adolescent Assessment:     CCA Substance Use Alcohol/Drug Use: Alcohol / Drug Use Pain Medications: SEE MAR Prescriptions: SEE MAR Over the Counter: SEE MAR History of alcohol / drug use?: Yes Substance #1 Name of Substance 1: Cocaine 1 - Amount (size/oz): Unknown 1 - Frequency: Binge use 1 - Last Use / Amount: 09/14/2020 -- ''A lot'' Substance #2 Name of Substance 2: THC 2 - Amount (size/oz): Unknown -- ''a lot'' 2 - Frequency: Binge use 2 - Last Use / Amount: 09/15/2020 -- ''A lot'' Substance #3 Name of Substance 3: ETOH 3 - Amount (size/oz): Unknown 3 - Frequency: Binge use 3 - Last Use / Amount: 09/14/2020 -- ''a lot''                   ASAM's:  Six Dimensions of Multidimensional Assessment  Dimension 1:  Acute Intoxication and/or Withdrawal Potential:   Dimension 1:  Description of individual's past and current  experiences of substance use and withdrawal: Pt endorsed binge use of alcohol, etoh, cocaine since Friday, August 26th  Dimension 2:  Biomedical Conditions and Complications:   Dimension 2:  Description of patient's biomedical conditions and  complications: Pt endorsed chest pain and ''black phlegm''  Dimension 3:  Emotional, Behavioral, or Cognitive Conditions and Complications:  Dimension 3:  Description of emotional, behavioral, or cognitive conditions and complications: Pt endorsed feeling overwhelmed/stressed  Dimension 4:  Readiness to  Change:     Dimension 5:  Relapse, Continued use, or Continued Problem Potential:     Dimension 6:  Recovery/Living Environment:     ASAM Severity Score: ASAM's Severity Rating Score: 10  ASAM Recommended Level of Treatment: ASAM Recommended Level of Treatment: Level II Intensive Outpatient Treatment   Substance use Disorder (SUD)    Recommendations for Services/Supports/Treatments:    Discharge Disposition:    DSM5 Diagnoses: Patient Active Problem List   Diagnosis Date Noted   Current moderate episode of major depressive disorder (HCC)    Suicidal behavior 12/12/2014   Acetaminophen overdose of undetermined intent 12/12/2014     Referrals to Alternative Service(s): Referred to Alternative Service(s):   Place:   Date:   Time:    Referred to Alternative Service(s):   Place:   Date:   Time:    Referred to Alternative Service(s):   Place:   Date:   Time:    Referred to Alternative Service(s):   Place:   Date:   Time:     Earline May[place], [facility]

## 2021-01-14 ENCOUNTER — Emergency Department (HOSPITAL_BASED_OUTPATIENT_CLINIC_OR_DEPARTMENT_OTHER): Payer: No Typology Code available for payment source

## 2021-01-14 ENCOUNTER — Emergency Department (HOSPITAL_BASED_OUTPATIENT_CLINIC_OR_DEPARTMENT_OTHER)
Admission: EM | Admit: 2021-01-14 | Discharge: 2021-01-14 | Disposition: A | Discharge status: Home / Self Care | Payer: No Typology Code available for payment source | Attending: Emergency Medicine | Admitting: Emergency Medicine

## 2021-01-14 ENCOUNTER — Emergency Department (HOSPITAL_BASED_OUTPATIENT_CLINIC_OR_DEPARTMENT_OTHER): Payer: No Typology Code available for payment source | Admitting: Radiology

## 2021-01-14 ENCOUNTER — Encounter (HOSPITAL_BASED_OUTPATIENT_CLINIC_OR_DEPARTMENT_OTHER): Payer: Self-pay

## 2021-01-14 ENCOUNTER — Other Ambulatory Visit: Payer: Self-pay

## 2021-01-14 DIAGNOSIS — F17210 Nicotine dependence, cigarettes, uncomplicated: Secondary | ICD-10-CM | POA: Insufficient documentation

## 2021-01-14 DIAGNOSIS — M25562 Pain in left knee: Secondary | ICD-10-CM | POA: Diagnosis not present

## 2021-01-14 DIAGNOSIS — Z79899 Other long term (current) drug therapy: Secondary | ICD-10-CM | POA: Insufficient documentation

## 2021-01-14 DIAGNOSIS — R519 Headache, unspecified: Secondary | ICD-10-CM | POA: Diagnosis not present

## 2021-01-14 DIAGNOSIS — M549 Dorsalgia, unspecified: Secondary | ICD-10-CM | POA: Diagnosis not present

## 2021-01-14 DIAGNOSIS — Z760 Encounter for issue of repeat prescription: Secondary | ICD-10-CM | POA: Diagnosis not present

## 2021-01-14 DIAGNOSIS — M542 Cervicalgia: Secondary | ICD-10-CM | POA: Diagnosis not present

## 2021-01-14 DIAGNOSIS — I10 Essential (primary) hypertension: Secondary | ICD-10-CM | POA: Insufficient documentation

## 2021-01-14 MED ORDER — HYDROCHLOROTHIAZIDE 12.5 MG PO TABS: 12.5000 mg | ORAL_TABLET | Freq: Every day | ORAL | 0 refills | Status: AC

## 2021-01-14 MED ORDER — METHOCARBAMOL 500 MG PO TABS: 500.0000 mg | ORAL_TABLET | Freq: Two times a day (BID) | ORAL | 0 refills | Status: AC

## 2021-01-14 MED ORDER — NAPROXEN 500 MG PO TABS: 500.0000 mg | ORAL_TABLET | Freq: Two times a day (BID) | ORAL | 0 refills | Status: AC

## 2021-01-14 MED ORDER — LIDOCAINE 5 % EX PTCH: 1.0000 | MEDICATED_PATCH | CUTANEOUS | 0 refills | Status: AC

## 2021-01-14 MED ORDER — KETOROLAC TROMETHAMINE 30 MG/ML IJ SOLN
30.0000 mg | Freq: Once | INTRAMUSCULAR | Status: AC
  Administered 2021-01-14: 19:00:00 30 mg via INTRAMUSCULAR
  Filled 2021-01-14: qty 1

## 2021-01-14 MED ORDER — METHOCARBAMOL 500 MG PO TABS
500.0000 mg | ORAL_TABLET | Freq: Once | ORAL | Status: AC
  Administered 2021-01-14: 19:00:00 500 mg via ORAL
  Filled 2021-01-14: qty 1
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
## 2021-01-14 NOTE — ED Notes (Signed)
EDP provided AVS using Teachback Method. Patient verbalizes understanding of Discharge Instructions. Opportunity for Questioning and Answers were provided by EDP. Patient Discharged from ED.  ° °

## 2021-01-14 NOTE — Discharge Instructions (Signed)
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
## 2022-07-22 ENCOUNTER — Emergency Department (HOSPITAL_BASED_OUTPATIENT_CLINIC_OR_DEPARTMENT_OTHER): Payer: Self-pay | Admitting: Radiology

## 2022-07-22 ENCOUNTER — Emergency Department (HOSPITAL_BASED_OUTPATIENT_CLINIC_OR_DEPARTMENT_OTHER)
Admission: EM | Admit: 2022-07-22 | Discharge: 2022-07-22 | Disposition: A | Discharge status: Home / Self Care | Payer: Self-pay | Attending: Emergency Medicine | Admitting: Emergency Medicine

## 2022-07-22 ENCOUNTER — Encounter (HOSPITAL_BASED_OUTPATIENT_CLINIC_OR_DEPARTMENT_OTHER): Payer: Self-pay

## 2022-07-22 ENCOUNTER — Other Ambulatory Visit: Payer: Self-pay

## 2022-07-22 DIAGNOSIS — M79675 Pain in left toe(s): Secondary | ICD-10-CM | POA: Insufficient documentation

## 2022-07-22 DIAGNOSIS — I10 Essential (primary) hypertension: Secondary | ICD-10-CM | POA: Insufficient documentation

## 2022-07-22 MED ORDER — ACETAMINOPHEN 500 MG PO TABS: 1000.0000 mg | ORAL_TABLET | Freq: Once | ORAL | Status: DC

## 2022-07-22 NOTE — ED Triage Notes (Signed)
Patient here POV from Home.  Endorses tripping and injuring his Left Great Toe 5 Days ago.   NAD Noted during Triage. A&Ox4. CGS 15. Ambulatory.

## 2022-07-22 NOTE — Discharge Instructions (Addendum)
Your x-ray was normal today.  Please take tylenol/ibuprofen for pain. I recommend close follow-up with PCP for reevaluation.  Please do not hesitate to return to emergency department if worrisome signs symptoms we discussed become apparent.
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
## 2023-08-25 ENCOUNTER — Ambulatory Visit: Payer: Self-pay | Admitting: Skilled Nursing Facility1

## 2023-09-23 ENCOUNTER — Ambulatory Visit: Attending: Family Medicine

## 2023-09-23 DIAGNOSIS — R262 Difficulty in walking, not elsewhere classified: Secondary | ICD-10-CM | POA: Diagnosis present

## 2023-09-23 DIAGNOSIS — M6281 Muscle weakness (generalized): Secondary | ICD-10-CM | POA: Insufficient documentation

## 2023-09-23 DIAGNOSIS — R293 Abnormal posture: Secondary | ICD-10-CM | POA: Diagnosis present
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
## 2023-12-01 ENCOUNTER — Other Ambulatory Visit: Payer: Self-pay

## 2023-12-01 ENCOUNTER — Emergency Department (HOSPITAL_BASED_OUTPATIENT_CLINIC_OR_DEPARTMENT_OTHER)
Admission: EM | Admit: 2023-12-01 | Discharge: 2023-12-01 | Disposition: A | Discharge status: Home / Self Care | Attending: Emergency Medicine | Admitting: Emergency Medicine

## 2023-12-01 ENCOUNTER — Emergency Department (HOSPITAL_BASED_OUTPATIENT_CLINIC_OR_DEPARTMENT_OTHER)

## 2023-12-01 ENCOUNTER — Encounter (HOSPITAL_BASED_OUTPATIENT_CLINIC_OR_DEPARTMENT_OTHER): Payer: Self-pay

## 2023-12-01 DIAGNOSIS — W260XXA Contact with knife, initial encounter: Secondary | ICD-10-CM | POA: Diagnosis not present

## 2023-12-01 DIAGNOSIS — Z23 Encounter for immunization: Secondary | ICD-10-CM | POA: Diagnosis not present

## 2023-12-01 DIAGNOSIS — S61012A Laceration without foreign body of left thumb without damage to nail, initial encounter: Secondary | ICD-10-CM | POA: Insufficient documentation

## 2023-12-01 MED ORDER — ACETAMINOPHEN 325 MG PO TABS
650.0000 mg | ORAL_TABLET | Freq: Once | ORAL | Status: AC
  Administered 2023-12-01: 650 mg via ORAL
  Filled 2023-12-01: qty 2

## 2023-12-01 MED ORDER — TETANUS-DIPHTH-ACELL PERTUSSIS 5-2-15.5 LF-MCG/0.5 IM SUSP
0.5000 mL | Freq: Once | INTRAMUSCULAR | Status: AC
  Administered 2023-12-01: 0.5 mL via INTRAMUSCULAR
  Filled 2023-12-01: qty 0.5

## 2023-12-01 MED ORDER — LIDOCAINE HCL (PF) 1 % IJ SOLN
5.0000 mL | Freq: Once | INTRAMUSCULAR | Status: AC
  Administered 2023-12-01: 5 mL
  Filled 2023-12-01: qty 5

## 2023-12-01 MED ORDER — BACITRACIN ZINC 500 UNIT/GM EX OINT: TOPICAL_OINTMENT | Freq: Two times a day (BID) | CUTANEOUS | Status: DC

## 2023-12-01 NOTE — ED Triage Notes (Signed)
 Patient here POV from home.  Lacerated Left lateral first digit with pocket knife while cooking 30 minutes ago. About 3-4 cm with bleeding controlled. Unsure of Tetanus but likely UTD.  NAD noted during Triage. A&Ox4. GCS 15. Ambulatory.

## 2023-12-01 NOTE — ED Provider Notes (Signed)
 [place] EMERGENCY DEPARTMENT AT [facility] Provider Note   CSN: [id number] Arrival date & time: 12/01/23  8042     Patient presents with: Laceration   Daniel Strickland is a 38 y.o. male presents today for laceration to his left thumb with a pocket knife while cooking approximately 30 minutes prior to arrival.  Patient unsure of when his last tetanus was.    Laceration      Prior to Admission medications   Medication Sig Start Date End Date Taking? Authorizing Provider  hydrochlorothiazide  (HYDRODIURIL ) 12.5 MG tablet Take 1 tablet (12.5 mg total) by mouth daily. Patient not taking: Reported on 09/23/2023 01/14/21 02/13/21  Henderly, Britni A, PA-C  lidocaine  (LIDODERM ) 5 % Place 1 patch onto the skin daily. Remove & Discard patch within 12 hours or as directed by MD Patient not taking: Reported on 09/23/2023 01/14/21   Henderly, Britni A, PA-C  methocarbamol  (ROBAXIN ) 500 MG tablet Take 1 tablet (500 mg total) by mouth 2 (two) times daily. Patient not taking: Reported on 09/23/2023 01/14/21   Henderly, Britni A, PA-C  naproxen  (NAPROSYN ) 500 MG tablet Take 1 tablet (500 mg total) by mouth 2 (two) times daily. Patient not taking: Reported on 09/23/2023 01/14/21   Henderly, Britni A, PA-C    Allergies: Patient has no known allergies.    Review of Systems  Skin:  Positive for wound.    Updated Vital Signs BP (!) 175/106 (BP Location: Right Arm)   Pulse (!) 103   Temp 98 F (36.7 C)   Resp 18   Ht 5' 11 (1.803 m)   Wt (!) 137.9 kg   SpO2 96%   BMI 42.40 kg/m   Physical Exam Vitals and nursing note reviewed.  Constitutional:      General: He is not in acute distress.    Appearance: He is well-developed. He is not toxic-appearing.  HENT:     Head: Normocephalic and atraumatic.  Eyes:     Conjunctiva/sclera: Conjunctivae normal.  Cardiovascular:     Rate and Rhythm: Normal rate and regular rhythm.     Heart sounds: No murmur heard. Pulmonary:     Effort:  Pulmonary effort is normal. No respiratory distress.     Breath sounds: Normal breath sounds.  Abdominal:     Palpations: Abdomen is soft.     Tenderness: There is no abdominal tenderness.  Musculoskeletal:        General: No swelling.     Cervical back: Neck supple.  Skin:    General: Skin is warm and dry.     Capillary Refill: Capillary refill takes less than 2 seconds.     Comments: Approximately 2 cm irregularly shaped laceration noted to the dorsal and lateral side of the left thumb crossing over the interphalangeal joint.  Patient is neurovascularly intact and has full ROM.  Wound is oozing on exam.  Neurological:     Mental Status: He is alert.  Psychiatric:        Mood and Affect: Mood normal.     (all labs ordered are listed, but only abnormal results are displayed) Labs Reviewed - No data to display  EKG: None  Radiology: DG Finger Thumb Left Result Date: 12/01/2023 EXAM: 3 VIEW(S) XRAY OF THE LEFT THUMB 12/01/2023 09:44:00 PM COMPARISON: None available. CLINICAL HISTORY: Laceration of thumb [id number] Laceration of thumb FINDINGS: BONES AND JOINTS: No acute fracture. No focal osseous lesion. No joint dislocation. No radiopaque foreign body. SOFT TISSUES: Bandage material about  the thumb. IMPRESSION: 1. No acute fracture or dislocation. Electronically signed by: Norman Gatlin MD 12/01/2023 09:46 PM EST RP Workstation: [id number]     .Laceration Repair  Date/Time: 12/01/2023 10:44 PM  Performed by: Francis Ileana SAILOR, PA-C Authorized by: Francis Ileana SAILOR, PA-C   Consent:    Consent obtained:  Verbal   Consent given by:  Patient   Risks discussed:  Infection, poor cosmetic result and pain Universal protocol:    Test results available: yes     Patient identity confirmed:  Arm band Anesthesia:    Anesthesia method:  Local infiltration   Local anesthetic:  Lidocaine  1% w/o epi Laceration details:    Location:  Finger   Finger location:  L thumb   Length (cm):  2    Depth (mm):  2 Pre-procedure details:    Preparation:  Imaging obtained to evaluate for foreign bodies Exploration:    Imaging obtained: x-ray     Imaging outcome: foreign body not noted     Wound exploration: wound explored through full range of motion and entire depth of wound visualized   Treatment:    Irrigation solution:  Sterile saline   Irrigation method:  Pressure wash Skin repair:    Repair method:  Sutures   Suture size:  5-0   Suture material:  Prolene   Suture technique:  Simple interrupted   Number of sutures:  6 Approximation:    Approximation:  Close Repair type:    Repair type:  Simple Post-procedure details:    Dressing:  Antibiotic ointment, splint for protection and non-adherent dressing   Procedure completion:  Tolerated    Medications Ordered in the ED  bacitracin ointment (has no administration in time range)  lidocaine  (PF) (XYLOCAINE ) 1 % injection 5 mL (5 mLs Infiltration Given 12/01/23 2206)  Tdap (ADACEL) injection 0.5 mL (0.5 mLs Intramuscular Given 12/01/23 2206)  acetaminophen  (TYLENOL ) tablet 650 mg (650 mg Oral Given 12/01/23 2212)                                    Medical Decision Making Amount and/or Complexity of Data Reviewed Radiology: ordered.  Risk OTC drugs. Prescription drug management.   This patient presents to the ED for concern of laceration differential diagnosis includes open fracture, laceration, dislocation, abrasion, avulsion   Imaging Studies ordered:  I ordered imaging studies including left thumb  I independently visualized and interpreted imaging which showed no acute fracture.  No focal osseous lesion.  No joint dislocation.  No radiopaque foreign body. I agree with the radiologist interpretation   Medicines ordered and prescription drug management:  I ordered medication including tdap and tylenol    I have reviewed the patients home medicines and have made adjustments as needed   Problem List / ED  Course:  Considered for admission or further workup however patient's vital signs, physical exam, and imaging are reassuring.  Patient symptoms likely due to acute laceration.  Patient's laceration was sutured while in emergency department.  Patient given aftercare instructions and return precautions.  I feel patient safe for discharge at this time.      Final diagnoses:  Laceration of left thumb without foreign body without damage to nail, initial encounter    ED Discharge Orders     None          Francis Ileana SAILOR, PA-C 12/01/23 2246    Dreama Longs, MD 12/02/23 1535

## 2023-12-01 NOTE — Discharge Instructions (Addendum)
 Today you were seen for a left thumb laceration.  Please follow-up in 7 to 10 days for evaluation of suture removal.  Please keep the area clean and dry.  Please return to the ED or urgent care if you have signs of infection to include red streaking up your hand, puslike discharge, or fever that does not go down with Tylenol  or Motrin .  Thank you for letting us  treat you today. After reviewing your imaging, I feel you are safe to go home. Please follow up with your PCP in the next several days and provide them with your records from this visit. Return to the Emergency Room if pain becomes severe or symptoms worsen.

## 2024-01-11 ENCOUNTER — Other Ambulatory Visit: Payer: Self-pay | Admitting: Family Medicine

## 2024-01-11 DIAGNOSIS — G8929 Other chronic pain: Secondary | ICD-10-CM

## 2024-01-11 DIAGNOSIS — M5440 Lumbago with sciatica, unspecified side: Secondary | ICD-10-CM

## 2024-01-27 ENCOUNTER — Ambulatory Visit
Admission: RE | Admit: 2024-01-27 | Discharge: 2024-01-27 | Disposition: A | Discharge status: Home / Self Care | Source: Ambulatory Visit | Attending: Family Medicine | Admitting: Family Medicine

## 2024-01-27 DIAGNOSIS — G8929 Other chronic pain: Secondary | ICD-10-CM

## 2024-01-27 DIAGNOSIS — M5440 Lumbago with sciatica, unspecified side: Secondary | ICD-10-CM

## 2024-02-11 ENCOUNTER — Ambulatory Visit
Admission: RE | Admit: 2024-02-11 | Discharge: 2024-02-11 | Disposition: A | Source: Ambulatory Visit | Attending: Family Medicine | Admitting: Family Medicine

## 2024-02-11 ENCOUNTER — Other Ambulatory Visit: Payer: Self-pay | Admitting: Family Medicine

## 2024-02-11 DIAGNOSIS — M79672 Pain in left foot: Secondary | ICD-10-CM
